# Patient Record
Sex: Female | Born: 1950 | Race: White | Hispanic: No | Marital: Married | Smoking: Never smoker
Health system: Southern US, Community
[De-identification: ages and names within clinical notes are randomized; demographics above are authoritative.]

## PROBLEM LIST (undated history)

## (undated) DIAGNOSIS — E7800 Pure hypercholesterolemia, unspecified: Secondary | ICD-10-CM

## (undated) DIAGNOSIS — L900 Lichen sclerosus et atrophicus: Secondary | ICD-10-CM

## (undated) DIAGNOSIS — E119 Type 2 diabetes mellitus without complications: Secondary | ICD-10-CM

## (undated) DIAGNOSIS — I10 Essential (primary) hypertension: Secondary | ICD-10-CM

## (undated) DIAGNOSIS — K219 Gastro-esophageal reflux disease without esophagitis: Secondary | ICD-10-CM

## (undated) DIAGNOSIS — F419 Anxiety disorder, unspecified: Secondary | ICD-10-CM

## (undated) DIAGNOSIS — E559 Vitamin D deficiency, unspecified: Secondary | ICD-10-CM

## (undated) HISTORY — DX: Anxiety disorder, unspecified: F41.9

## (undated) HISTORY — DX: Pure hypercholesterolemia, unspecified: E78.00

## (undated) HISTORY — DX: Vitamin D deficiency, unspecified: E55.9

## (undated) HISTORY — DX: Type 2 diabetes mellitus without complications: E11.9

## (undated) HISTORY — DX: Lichen sclerosus et atrophicus: L90.0

## (undated) HISTORY — PX: SHOULDER SURGERY: SHX246

## (undated) HISTORY — PX: TUBAL LIGATION: SHX77

## (undated) HISTORY — DX: Gastro-esophageal reflux disease without esophagitis: K21.9

## (undated) HISTORY — DX: Essential (primary) hypertension: I10

---

## 2005-01-21 ENCOUNTER — Ambulatory Visit: Payer: Self-pay | Admitting: Unknown Physician Specialty

## 2005-03-17 ENCOUNTER — Ambulatory Visit: Payer: Self-pay | Admitting: Internal Medicine

## 2006-05-19 ENCOUNTER — Ambulatory Visit: Payer: Self-pay | Admitting: Unknown Physician Specialty

## 2007-10-26 ENCOUNTER — Ambulatory Visit: Payer: Self-pay | Admitting: Unknown Physician Specialty

## 2007-12-20 ENCOUNTER — Ambulatory Visit: Payer: Self-pay | Admitting: Unknown Physician Specialty

## 2008-02-14 ENCOUNTER — Ambulatory Visit: Payer: Self-pay | Admitting: Unknown Physician Specialty

## 2008-12-24 ENCOUNTER — Ambulatory Visit: Payer: Self-pay | Admitting: Unknown Physician Specialty

## 2010-03-18 ENCOUNTER — Ambulatory Visit: Payer: Self-pay | Admitting: Unknown Physician Specialty

## 2011-06-16 ENCOUNTER — Ambulatory Visit: Payer: Self-pay | Admitting: Unknown Physician Specialty

## 2013-04-24 ENCOUNTER — Ambulatory Visit: Payer: Self-pay | Admitting: Internal Medicine

## 2013-06-02 ENCOUNTER — Ambulatory Visit: Payer: Self-pay | Admitting: Unknown Physician Specialty

## 2013-08-22 ENCOUNTER — Ambulatory Visit: Payer: Self-pay | Admitting: Unknown Physician Specialty

## 2013-08-22 DIAGNOSIS — I10 Essential (primary) hypertension: Secondary | ICD-10-CM

## 2013-08-22 LAB — BASIC METABOLIC PANEL
Anion Gap: 2 — ABNORMAL LOW (ref 7–16)
Calcium, Total: 9.4 mg/dL (ref 8.5–10.1)
Co2: 33 mmol/L — ABNORMAL HIGH (ref 21–32)
Osmolality: 283 (ref 275–301)

## 2013-08-25 ENCOUNTER — Ambulatory Visit: Payer: Self-pay | Admitting: Unknown Physician Specialty

## 2014-04-06 ENCOUNTER — Ambulatory Visit: Payer: Self-pay | Admitting: Gastroenterology

## 2014-05-02 DIAGNOSIS — E1122 Type 2 diabetes mellitus with diabetic chronic kidney disease: Secondary | ICD-10-CM | POA: Insufficient documentation

## 2014-05-02 DIAGNOSIS — K219 Gastro-esophageal reflux disease without esophagitis: Secondary | ICD-10-CM | POA: Insufficient documentation

## 2014-05-02 DIAGNOSIS — N1832 Chronic kidney disease, stage 3b: Secondary | ICD-10-CM | POA: Insufficient documentation

## 2014-05-02 DIAGNOSIS — E785 Hyperlipidemia, unspecified: Secondary | ICD-10-CM | POA: Insufficient documentation

## 2014-05-02 DIAGNOSIS — I10 Essential (primary) hypertension: Secondary | ICD-10-CM | POA: Insufficient documentation

## 2014-05-02 DIAGNOSIS — E119 Type 2 diabetes mellitus without complications: Secondary | ICD-10-CM | POA: Insufficient documentation

## 2014-06-01 ENCOUNTER — Ambulatory Visit: Payer: Self-pay | Admitting: Internal Medicine

## 2014-10-31 DIAGNOSIS — N182 Chronic kidney disease, stage 2 (mild): Secondary | ICD-10-CM | POA: Insufficient documentation

## 2014-12-22 NOTE — Op Note (Signed)
PATIENT NAME:  Krystal Hoffman, Krystal Hoffman MR#:  [id number] DATE OF BIRTH:  Nov 07, 1950  DATE OF PROCEDURE:  08/25/2013  PREOPERATIVE DIAGNOSIS:  Probable torn left rotator cuff with secondary impingement along with biceps tendinosis.   POSTOPERATIVE DIAGNOSIS:  Probable torn left rotator cuff with secondary impingement along with biceps tendinosis.  OPERATION:  Arthroscopic release of the long head biceps tendon plus arthroscopic subacromial decompression followed by mini incision rotator cuff repair.   SURGEON:  Alda Berthold., M.D.   ANESTHESIA:  Interscalene block plus general.   HISTORY:  The patient had a long history of left shoulder pain.  She had been refractory to injection of her left shoulder joint with steroid and anesthetic.  MRI was consistent with a probable rotator cuff tear along with some biceps tendinosis.  The patient was ultimately brought in for surgery due to persistent symptoms.   DESCRIPTION OF PROCEDURE:  The patient had an interscalene block on her left shoulder in the preop area and then was taken to the Operating Room where satisfactory general anesthesia was achieved.  The patient was turned to the lateral decubitus position with the left shoulder up.  The left shoulder was prepped and draped in the usual fashion for an arthroscopic procedure.  The shoulder was suspended with the Acufex shoulder suspension device.  Ten pounds of traction was maintained throughout the procedure.    The patient was given 1 gram of Kefzol IV at the start of the procedure.   The scope was introduced through a posterior puncture wound.  The joint was distended with lactated Ringer's.  Inspection of the glenohumeral joint revealed the articular surfaces were smooth.  The superior labrum was slightly frayed.  The biceps tendon appeared to be subluxed and there appeared to be a tear of the subscapularis.    There also appeared to be a significant rotator cuff tear anteriorly.    I went ahead and  established an anterior portal and then inserted an angled ArthroCare wand into the joint and used it to divide the attachment of the biceps tendon to the superior labrum.  I then inserted the scope into the subacromial space.  A lateral portal was established.  Visualization of the subacromial space revealed significant fraying of the inferior surface of the acromion.  There was some reactive synovitis laterally and anteriorly.  The patient had a tear of the anterior attachment of the supraspinatus.  The tear was several centimeters in width and it was retracted several centimeters.    I went ahead and removed the soft tissue under the acromion with an ArthroCare wand and then the scope was switched to the lateral portal and I brought an acromionizer bur through the posterior portal and performed a subacromial decompression.  The acromial attachment of the coracoacromial ligament was released at this time.    I then switched the scope back to the posterior portal and brought a large round bur in through the lateral portal and used it to lightly decorticate the intended reattachment site of the cuff to the greater tuberosity.   The instruments were removed from the shoulder joint and then the lateral portal was extended proximally to the edge of the acromion.  The deltoid was divided in line with its fibers.  A Gelpi retractor was inserted.  I went ahead and made some small venting holes in the greater tuberosity with an angled awl and then repaired the cuff to the greater tuberosity.  I used a double row repair.  Medially, I used a Electronics engineerpartan anchor with a #2 fiber wire and then more anterolaterally I used two 5.5 speed screws with a #2 fiber wire.  The sutures were  placed in horizontal mattress fashion.  I went ahead and irrigated the subacromial space.  The deltoid interval was closed with #1 Vicryl and the subcutaneous with 2-0 Vicryl and the skin with skin staples.  Puncture wound was closed with 3-0 nylon  in vertical mattress fashion.  Several milliliters of 0.5% Marcaine with epinephrine was injected about each puncture wound and then 4 to 5 mL was injected into the incision.   A sterile dressing were applied with four TENS pads and then a shoulder immobilizer was applied.  She was moved  to a stretcher bed and awakened and then taken to the recovery room in satisfactory condition.  Blood loss was negligible.    ____________________________ Alda BertholdHarold B. Makenzy Krist Jr., MD hbk:ea D: 08/25/2013 16:30:45 ET T: 08/26/2013 02:52:12 ET JOB#: [id number]  cc: Alda BertholdHarold B. Vonnetta Akey Jr., MD, <Dictator> Randon GoldsmithHAROLD B Avner Stroder, Montez HagemanJR MD ELECTRONICALLY SIGNED 09/25/2013 12:48

## 2015-05-22 ENCOUNTER — Encounter: Payer: Self-pay | Admitting: Obstetrics and Gynecology

## 2015-06-06 ENCOUNTER — Ambulatory Visit (INDEPENDENT_AMBULATORY_CARE_PROVIDER_SITE_OTHER): Payer: BC Managed Care – PPO | Admitting: Obstetrics and Gynecology

## 2015-06-06 ENCOUNTER — Encounter: Payer: Self-pay | Admitting: Obstetrics and Gynecology

## 2015-06-06 VITALS — BP 119/72 | HR 65 | Ht 67.0 in | Wt 259.8 lb

## 2015-06-06 DIAGNOSIS — Z0000 Encounter for general adult medical examination without abnormal findings: Secondary | ICD-10-CM | POA: Diagnosis not present

## 2015-06-06 DIAGNOSIS — Z1211 Encounter for screening for malignant neoplasm of colon: Secondary | ICD-10-CM | POA: Diagnosis not present

## 2015-06-06 DIAGNOSIS — L900 Lichen sclerosus et atrophicus: Secondary | ICD-10-CM | POA: Diagnosis not present

## 2015-06-06 DIAGNOSIS — Z780 Asymptomatic menopausal state: Secondary | ICD-10-CM

## 2015-06-06 DIAGNOSIS — E669 Obesity, unspecified: Secondary | ICD-10-CM | POA: Diagnosis not present

## 2015-06-06 DIAGNOSIS — IMO0001 Reserved for inherently not codable concepts without codable children: Secondary | ICD-10-CM | POA: Insufficient documentation

## 2015-06-06 DIAGNOSIS — Z01419 Encounter for gynecological examination (general) (routine) without abnormal findings: Secondary | ICD-10-CM

## 2015-06-06 DIAGNOSIS — Z1231 Encounter for screening mammogram for malignant neoplasm of breast: Secondary | ICD-10-CM

## 2015-06-06 MED ORDER — CLOBETASOL PROPIONATE 0.05 % EX OINT: 1.0000 "application " | TOPICAL_OINTMENT | CUTANEOUS | Status: DC | PRN

## 2015-06-06 NOTE — Progress Notes (Signed)
Patient ID: Krystal Hoffman, female   DOB: [birth year], 64 y.o.   MRN: [id number] ANNUAL PREVENTATIVE CARE GYN  ENCOUNTER NOTE  Subjective:       Krystal Hoffman is a 64 y.o. No obstetric history on file. female here for a routine annual gynecologic exam.  Current complaints: 1.  none  2.  History of lichen sclerosus, clinically stable on therapy   Gynecologic History No LMP recorded. Patient is postmenopausal. Contraception: post menopausal status Last Pap: 04/26/2013 neg/neg. Results were: normal Last mammogram: unsure last one. Results were: normal  Obstetric History OB History  No data available    Past Medical History  Diagnosis Date  . Lichen sclerosus   . Hypercholesteremia   . Diabetes mellitus without complication (HCC)   . Hypertension   . GERD (gastroesophageal reflux disease)   . Anxiety   . Vitamin D deficiency     Past Surgical History  Procedure Laterality Date  . Tubal ligation      1986  . Shoulder surgery      tear    No current outpatient prescriptions on file prior to visit.   No current facility-administered medications on file prior to visit.    Allergies  Allergen Reactions  . Metformin Diarrhea    Social History   Social History  . Marital Status: Married    Spouse Name: N/A  . Number of Children: N/A  . Years of Education: N/A   Occupational History  . Not on file.   Social History Main Topics  . Smoking status: Never Smoker   . Smokeless tobacco: Not on file  . Alcohol Use: No  . Drug Use: No  . Sexual Activity: Yes    Birth Control/ Protection: Post-menopausal   Other Topics Concern  . Not on file   Social History Narrative  . No narrative on file    Family History  Problem Relation Age of Onset  . Lung cancer Mother   . Uterine cancer Mother   . Brain cancer Father   . Ovarian cancer Neg Hx   . Drug abuse Neg Hx   . Heart disease Neg Hx     The following portions of the patient's history were reviewed and updated as  appropriate: allergies, current medications, past family history, past medical history, past social history, past surgical history and problem list.  Review of Systems ROS Review of Systems - General ROS: negative for - chills, fatigue, fever, hot flashes, night sweats, weight gain or weight loss Psychological ROS: negative for - anxiety, decreased libido, depression, mood swings, physical abuse or sexual abuse Ophthalmic ROS: negative for - blurry vision, eye pain or loss of vision ENT ROS: negative for - headaches, hearing change, visual changes or vocal changes Allergy and Immunology ROS: negative for - hives, itchy/watery eyes or seasonal allergies Hematological and Lymphatic ROS: negative for - bleeding problems, bruising, swollen lymph nodes or weight loss Endocrine ROS: negative for - galactorrhea, hair pattern changes, hot flashes, malaise/lethargy, mood swings, palpitations, polydipsia/polyuria, skin changes, temperature intolerance or unexpected weight changes Breast ROS: negative for - new or changing breast lumps or nipple discharge Respiratory ROS: negative for - cough or shortness of breath Cardiovascular ROS: negative for - chest pain, irregular heartbeat, palpitations or shortness of breath Gastrointestinal ROS: no abdominal pain, change in bowel habits, or black or bloody stools Genito-Urinary ROS: no dysuria, trouble voiding, or hematuria Musculoskeletal ROS: negative for - joint pain or joint stiffness Neurological ROS: negative for -  bowel and bladder control changes Dermatological ROS: negative for rash and skin lesion changes   Objective:   BP 119/72 mmHg  Pulse 65  Ht  (1.702 m)  Wt 259 lb 12.8 oz (117.845 kg)  BMI 40.68 kg/m2 CONSTITUTIONAL: Well-developed, well-nourished female in no acute distress.  PSYCHIATRIC: Normal mood and affect. Normal behavior. Normal judgment and thought content. NEUROLGIC: Alert and oriented to person, place, and time. Normal  muscle tone coordination. No cranial nerve deficit noted. HENT:  Normocephalic, atraumatic, External right and left ear normal. Oropharynx is clear and moist EYES: Conjunctivae and EOM are normal. Pupils are equal, round, and reactive to light. No scleral icterus.  NECK: Normal range of motion, supple, no masses.  Normal thyroid.  SKIN: Skin is warm and dry. No rash noted. Not diaphoretic. No erythema. No pallor. CARDIOVASCULAR: Normal heart rate noted, regular rhythm, no murmur. RESPIRATORY: Clear to auscultation bilaterally. Effort and breath sounds normal, no problems with respiration noted. BREASTS: Symmetric in size. No masses, skin changes, nipple drainage, or lymphadenopathy. ABDOMEN: Soft, normal bowel sounds, no distention noted.  No tenderness, rebound or guarding.  BLADDER: Normal PELVIC:  External Genitalia: Normal  BUS: Normal  Vagina: Normal; mild atrophic changes  Cervix: Normal; no cervical motion tenderness  Uterus: Normal; mobile, midline, nontender, nonenlarged  Adnexa: Normal  RV: External Exam NormaI, No Rectal Masses and Normal Sphincter tone  MUSCULOSKELETAL: Normal range of motion. No tenderness.  No cyanosis, clubbing, or edema.  2+ distal pulses. LYMPHATIC: No Axillary, Supraclavicular, or Inguinal Adenopathy.    Assessment:   Annual gynecologic examination 64 y.o. Contraception: post menopausal status Obesity 1 Lichen sclerosis, stable  Plan:  Pap: Not needed Mammogram: Ordered Stool Guaiac Testing:  Ordered Labs: thru pcp Routine preventative health maintenance measures emphasized: Exercise/Diet/Weight control, Tobacco Warnings and Alcohol/Substance use risks Refill clobetasol; applied topically twice weekly Return to Clinic - 1 [address] [place], CMA  Herold Harms, MD

## 2015-06-06 NOTE — Patient Instructions (Signed)
1.  Pap smear not done until 2017 2.  Mammogram ordered. 3.  Stool guaiac cards testing ordered. 4.  Clobetasol 0.05% refilled; apply topically to the vulva twice a week. 5.   Return in 1 year physical.

## 2015-06-27 LAB — FECAL OCCULT BLOOD, IMMUNOCHEMICAL: FECAL OCCULT BLD: NEGATIVE

## 2015-06-28 ENCOUNTER — Ambulatory Visit: Payer: BC Managed Care – PPO | Attending: Obstetrics and Gynecology

## 2015-07-01 ENCOUNTER — Ambulatory Visit
Admission: RE | Admit: 2015-07-01 | Discharge: 2015-07-01 | Disposition: A | Discharge status: Home / Self Care | Payer: BC Managed Care – PPO | Source: Ambulatory Visit | Attending: Obstetrics and Gynecology | Admitting: Obstetrics and Gynecology

## 2015-07-01 DIAGNOSIS — Z1231 Encounter for screening mammogram for malignant neoplasm of breast: Secondary | ICD-10-CM | POA: Diagnosis present

## 2016-06-05 ENCOUNTER — Other Ambulatory Visit: Payer: Self-pay | Admitting: Internal Medicine

## 2016-06-05 DIAGNOSIS — Z1231 Encounter for screening mammogram for malignant neoplasm of breast: Secondary | ICD-10-CM

## 2016-06-08 NOTE — Progress Notes (Signed)
pANNUAL PREVENTATIVE CARE GYN  ENCOUNTER NOTE  Subjective:       Krystal Hoffman is a 65 y.o. G3  P 2012  female here for a routine annual gynecologic exam.  Current complaints: 1.  None 2. History of lichen sclerosus, clinically stable with therapy once or twice every 1-2 weeks  No significant vasomotor symptoms. Bowel and bladder function are reportedly normal. Her weight has waxed and waned over the past year   Gynecologic History No LMP recorded. Patient is postmenopausal. Contraception: post menopausal status Last Pap: 03/2013 neg/neg. Results were: normal Last mammogram: 08/2014 birad 1. Results were: normal  Obstetric History OB History  No data available    Past Medical History:  Diagnosis Date  . Anxiety   . Diabetes mellitus without complication (HCC)   . GERD (gastroesophageal reflux disease)   . Hypercholesteremia   . Hypertension   . Lichen sclerosus   . Vitamin D deficiency     Past Surgical History:  Procedure Laterality Date  . SHOULDER SURGERY     tear  . TUBAL LIGATION     1986    Current Outpatient Prescriptions on File Prior to Visit  Medication Sig Dispense Refill  . amLODipine (NORVASC) 10 MG tablet TAKE 1 TABLET DAILY (CALL FOR APPOINTMENT)    . atorvastatin (LIPITOR) 10 MG tablet TAKE 1 TABLET DAILY    . clobetasol ointment (TEMOVATE) 0.05 % Apply 1 application topically as needed. 30 g 6  . FLUoxetine (PROZAC) 20 MG capsule TAKE 1 CAPSULE DAILY    . glimepiride (AMARYL) 4 MG tablet TAKE ONE-HALF (1/2) TABLET TWICE A DAY    . glucose blood (BAYER CONTOUR NEXT TEST) test strip USE TWICE A DAY    . hydrochlorothiazide (MICROZIDE) 12.5 MG capsule TAKE 1 CAPSULE DAILY    . losartan (COZAAR) 100 MG tablet TAKE 1 TABLET DAILY (CALL FOR APPOINTMENT, NEED COMPLETE PHYSICAL EXAM)    . Multiple Vitamins tablet Take by mouth.    Marland Kitchen. omeprazole (PRILOSEC) 20 MG capsule TAKE 1 CAPSULE DAILY    . sitaGLIPtin (JANUVIA) 100 MG tablet Take by mouth.    . Vitamin D,  Ergocalciferol, (DRISDOL) 50000 UNITS CAPS capsule TAKE 1 CAPSULE TWICE WEEKLY     No current facility-administered medications on file prior to visit.     Allergies  Allergen Reactions  . Metformin Diarrhea    Social History   Social History  . Marital status: Married    Spouse name: N/A  . Number of children: N/A  . Years of education: N/A   Occupational History  . Not on file.   Social History Main Topics  . Smoking status: Never Smoker  . Smokeless tobacco: Not on file  . Alcohol use No  . Drug use: No  . Sexual activity: Yes    Birth control/ protection: Post-menopausal   Other Topics Concern  . Not on file   Social History Narrative  . No narrative on file    Family History  Problem Relation Age of Onset  . Lung cancer Mother   . Uterine cancer Mother   . Brain cancer Father   . Ovarian cancer Neg Hx   . Drug abuse Neg Hx   . Heart disease Neg Hx     The following portions of the patient's history were reviewed and updated as appropriate: allergies, current medications, past family history, past medical history, past social history, past surgical history and problem list.  Review of Systems ROS Review of  Systems - General ROS: negative for - chills, fatigue, fever, hot flashes, night sweats, weight gain or weight loss Psychological ROS: negative for - anxiety, decreased libido, depression, mood swings, physical abuse or sexual abuse Ophthalmic ROS: negative for - blurry vision, eye pain or loss of vision ENT ROS: negative for - headaches, hearing change, visual changes or vocal changes Allergy and Immunology ROS: negative for - hives, itchy/watery eyes or seasonal allergies Hematological and Lymphatic ROS: negative for - bleeding problems, bruising, swollen lymph nodes or weight loss Endocrine ROS: negative for - galactorrhea, hair pattern changes, hot flashes, malaise/lethargy, mood swings, palpitations, polydipsia/polyuria, skin changes, temperature  intolerance or unexpected weight changes Breast ROS: negative for - new or changing breast lumps or nipple discharge Respiratory ROS: negative for - cough or shortness of breath Cardiovascular ROS: negative for - chest pain, irregular heartbeat, palpitations or shortness of breath Gastrointestinal ROS: no abdominal pain, change in bowel habits, or black or bloody stools Genito-Urinary ROS: no dysuria, trouble voiding, or hematuria Musculoskeletal ROS: negative for - joint pain or joint stiffness Neurological ROS: negative for - bowel and bladder control changes Dermatological ROS: negative for rash and skin lesion changes   Objective:  BP 133/74   Pulse 76   Ht 5\' 7"  (1.702 m)   Wt 282 lb 1.6 oz (128 kg)   BMI 44.18 kg/m  CONSTITUTIONAL: Well-developed, well-nourished female in no acute distress.  PSYCHIATRIC: Normal mood and affect. Normal behavior. Normal judgment and thought content. NEUROLGIC: Alert and oriented to person, place, and time. Normal muscle tone coordination. No cranial nerve deficit noted. HENT:  Normocephalic, atraumatic, External right and left ear normal. Oropharynx is clear and moist EYES: Conjunctivae and EOM are normal. . No scleral icterus.  NECK: Normal range of motion, supple, no masses.  Normal thyroid.  SKIN: Skin is warm and dry. No rash noted. Not diaphoretic. No erythema. No pallor. CARDIOVASCULAR: Normal heart rate noted, regular rhythm, no murmur. RESPIRATORY: Clear to auscultation bilaterally. Effort and breath sounds normal, no problems with respiration noted. BREASTS: Symmetric in size. No masses, skin changes, nipple drainage, or lymphadenopathy. ABDOMEN: Soft, normal bowel sounds, no distention noted.  No tenderness, rebound or guarding.  BLADDER: Normal PELVIC:  External Genitalia: Normal; labia minora slightly agglutinated to the majora; no leukoplakia or epithelial erosions are noted  BUS: Normal  Vagina: Mild atrophy  Cervix: Normal; no  cervical motion tenderness  Uterus: Normal; midplane, normal size and shape, nontender  Adnexa: Normal; nonpalpable and nontender  RV: External Exam NormaI, No Rectal Masses and Normal Sphincter tone  MUSCULOSKELETAL: Normal range of motion. No tenderness.  No cyanosis, clubbing, or edema.  2+ distal pulses. LYMPHATIC: No Axillary, Supraclavicular, or Inguinal Adenopathy.    Assessment:   Annual gynecologic examination 65 y.o. Contraception: post menopausal status bmi-40 Problem List Items Addressed This Visit    Menopause    Other Visit Diagnoses    Well woman exam    -  Primary   Encounter for screening mammogram for breast cancer       Screening for colon cancer       Class 3 obesity due to excess calories with body mass index (BMI) of 40.0 to 44.9 in adult, unspecified whether serious comorbidity present (HCC)         Lichen sclerosus, clinically stable Plan:  Pap: Pap Co Test Mammogram: scheduled for 07/14/2016 Stool Guaiac Testing:  Ordered Labs: thru pcp Routine preventative health maintenance measures emphasized: Exercise/Diet/Weight control, Tobacco Warnings, Alcohol/Substance  use risks, Stress Management and Peer Pressure Issues Continue with clobetasol ointment 1-2 times per week as needed Return to Clinic - 1 Year   [place], CMA  Herold Harms, MD  Note: This dictation was prepared with Dragon dictation along with smaller phrase technology. Any transcriptional errors that result from this process are unintentional.

## 2016-06-10 ENCOUNTER — Encounter: Payer: Self-pay | Admitting: Obstetrics and Gynecology

## 2016-06-10 ENCOUNTER — Ambulatory Visit (INDEPENDENT_AMBULATORY_CARE_PROVIDER_SITE_OTHER): Payer: BC Managed Care – PPO | Admitting: Obstetrics and Gynecology

## 2016-06-10 VITALS — BP 133/74 | HR 76 | Ht 67.0 in | Wt 282.1 lb

## 2016-06-10 DIAGNOSIS — E6609 Other obesity due to excess calories: Secondary | ICD-10-CM

## 2016-06-10 DIAGNOSIS — Z1211 Encounter for screening for malignant neoplasm of colon: Secondary | ICD-10-CM

## 2016-06-10 DIAGNOSIS — Z1231 Encounter for screening mammogram for malignant neoplasm of breast: Secondary | ICD-10-CM

## 2016-06-10 DIAGNOSIS — L900 Lichen sclerosus et atrophicus: Secondary | ICD-10-CM | POA: Diagnosis not present

## 2016-06-10 DIAGNOSIS — Z780 Asymptomatic menopausal state: Secondary | ICD-10-CM

## 2016-06-10 DIAGNOSIS — Z6841 Body Mass Index (BMI) 40.0 and over, adult: Secondary | ICD-10-CM

## 2016-06-10 DIAGNOSIS — Z01419 Encounter for gynecological examination (general) (routine) without abnormal findings: Secondary | ICD-10-CM | POA: Diagnosis not present

## 2016-06-10 DIAGNOSIS — IMO0001 Reserved for inherently not codable concepts without codable children: Secondary | ICD-10-CM

## 2016-06-10 NOTE — Patient Instructions (Signed)
1. Pap smear is completed 2. Mammogram already ordered 3. Stool guaiac cards are given for colon cancer screening 4. Continue with healthy eating and exercise with controlled weight loss 5. Add calcium 1200 mg a day to her vitamin D regimen 6. Return in 1 year for annual exam

## 2016-06-18 LAB — PAP IG AND HPV HIGH-RISK
HPV, HIGH-RISK: NEGATIVE
PAP SMEAR COMMENT: 0

## 2016-07-03 LAB — FECAL OCCULT BLOOD, IMMUNOCHEMICAL: FECAL OCCULT BLD: NEGATIVE

## 2016-07-14 ENCOUNTER — Ambulatory Visit
Admission: RE | Admit: 2016-07-14 | Discharge: 2016-07-14 | Disposition: A | Discharge status: Home / Self Care | Payer: Medicare Other | Source: Ambulatory Visit | Attending: Internal Medicine | Admitting: Internal Medicine

## 2016-07-14 DIAGNOSIS — Z1231 Encounter for screening mammogram for malignant neoplasm of breast: Secondary | ICD-10-CM | POA: Insufficient documentation

## 2016-08-11 ENCOUNTER — Other Ambulatory Visit: Payer: Self-pay

## 2016-08-11 MED ORDER — CLOBETASOL PROPIONATE 0.05 % EX OINT: 1.0000 "application " | TOPICAL_OINTMENT | CUTANEOUS | 6 refills | Status: DC | PRN

## 2017-06-14 NOTE — Progress Notes (Deleted)
pANNUAL PREVENTATIVE CARE GYN  ENCOUNTER NOTE  Subjective:       Krystal Hoffman is a 66 y.o. G3  P 2012  female here for a routine annual gynecologic exam.  Current complaints: 1.  None 2. History of lichen sclerosus, clinically stable with therapy once or twice every 1-2 weeks  No significant vasomotor symptoms. Bowel and bladder function are reportedly normal. Her weight has waxed and waned over the past year   Gynecologic History No LMP recorded. Patient is postmenopausal. Contraception: post menopausal status Last Pap: 05/2016 neg/neg. Results were: normal Last mammogram: 07/2016 birad 2 . Results were: normal  Obstetric History OB History  Gravida Para Term Preterm AB Living  SAB TAB Ectopic Multiple Live Births  1       2    # Outcome Date GA Lbr Len/2nd Weight Sex Delivery Anes PTL Lv  3 Term 22    M Vag-Spont   LIV  2 Term 55    F Vag-Spont   LIV  1 SAB               Past Medical History:  Diagnosis Date  . Anxiety   . Diabetes mellitus without complication (HCC)   . GERD (gastroesophageal reflux disease)   . Hypercholesteremia   . Hypertension   . Lichen sclerosus   . Vitamin D deficiency     Past Surgical History:  Procedure Laterality Date  . SHOULDER SURGERY     tear  . TUBAL LIGATION     1986    Current Outpatient Prescriptions on File Prior to Visit  Medication Sig Dispense Refill  . amLODipine (NORVASC) 10 MG tablet TAKE 1 TABLET DAILY (CALL FOR APPOINTMENT)    . atorvastatin (LIPITOR) 10 MG tablet TAKE 1 TABLET DAILY    . clobetasol ointment (TEMOVATE) 0.05 % Apply 1 application topically as needed. 30 g 6  . FLUoxetine (PROZAC) 20 MG capsule TAKE 1 CAPSULE DAILY    . glimepiride (AMARYL) 4 MG tablet TAKE ONE-HALF (1/2) TABLET TWICE A DAY    . glucose blood (BAYER CONTOUR NEXT TEST) test strip USE TWICE A DAY    . hydrochlorothiazide (MICROZIDE) 12.5 MG capsule TAKE 1 CAPSULE DAILY    . losartan (COZAAR) 100 MG tablet TAKE 1  TABLET DAILY (CALL FOR APPOINTMENT, NEED COMPLETE PHYSICAL EXAM)    . Multiple Vitamins tablet Take by mouth.    Marland Kitchen omeprazole (PRILOSEC) 20 MG capsule TAKE 1 CAPSULE DAILY    . sitaGLIPtin (JANUVIA) 100 MG tablet Take by mouth.    . Vitamin D, Ergocalciferol, (DRISDOL) 50000 UNITS CAPS capsule TAKE 1 CAPSULE TWICE WEEKLY     No current facility-administered medications on file prior to visit.     Allergies  Allergen Reactions  . Metformin Diarrhea    Social History   Social History  . Marital status: Married    Spouse name: N/A  . Number of children: N/A  . Years of education: N/A   Occupational History  . Not on file.   Social History Main Topics  . Smoking status: Never Smoker  . Smokeless tobacco: Never Used  . Alcohol use No  . Drug use: No  . Sexual activity: Yes    Birth control/ protection: Post-menopausal   Other Topics Concern  . Not on file   Social History Narrative  . No narrative on file    Family History  Problem Relation Age of Onset  .  Lung cancer Mother   . Uterine cancer Mother   . Brain cancer Father   . Ovarian cancer Neg Hx   . Drug abuse Neg Hx   . Heart disease Neg Hx   . Breast cancer Neg Hx   . Colon cancer Neg Hx   . Diabetes Neg Hx     The following portions of the patient's history were reviewed and updated as appropriate: allergies, current medications, past family history, past medical history, past social history, past surgical history and problem list.  Review of Systems ROS   Objective:  There were no vitals taken for this visit. CONSTITUTIONAL: Well-developed, well-nourished female in no acute distress.  PSYCHIATRIC: Normal mood and affect. Normal behavior. Normal judgment and thought content. NEUROLGIC: Alert and oriented to person, place, and time. Normal muscle tone coordination. No cranial nerve deficit noted. HENT:  Normocephalic, atraumatic, External right and left ear normal. Oropharynx is clear and moist EYES:  Conjunctivae and EOM are normal. . No scleral icterus.  NECK: Normal range of motion, supple, no masses.  Normal thyroid.  SKIN: Skin is warm and dry. No rash noted. Not diaphoretic. No erythema. No pallor. CARDIOVASCULAR: Normal heart rate noted, regular rhythm, no murmur. RESPIRATORY: Clear to auscultation bilaterally. Effort and breath sounds normal, no problems with respiration noted. BREASTS: Symmetric in size. No masses, skin changes, nipple drainage, or lymphadenopathy. ABDOMEN: Soft, normal bowel sounds, no distention noted.  No tenderness, rebound or guarding.  BLADDER: Normal PELVIC:  External Genitalia: Normal; labia minora slightly agglutinated to the majora; no leukoplakia or epithelial erosions are noted  BUS: Normal  Vagina: Mild atrophy  Cervix: Normal; no cervical motion tenderness  Uterus: Normal; midplane, normal size and shape, nontender  Adnexa: Normal; nonpalpable and nontender  RV: External Exam NormaI, No Rectal Masses and Normal Sphincter tone  MUSCULOSKELETAL: Normal range of motion. No tenderness.  No cyanosis, clubbing, or edema.  2+ distal pulses. LYMPHATIC: No Axillary, Supraclavicular, or Inguinal Adenopathy.    Assessment:   Annual gynecologic examination 66 y.o. Contraception: post menopausal status bmi-40 Problem List Items Addressed This Visit    Lichen sclerosus   Menopause    Other Visit Diagnoses    Well woman exam    -  Primary   Encounter for screening mammogram for breast cancer       Screening for colon cancer         Lichen sclerosus, clinically stable Plan:  Pap: Due 2020 Mammogram: ordered Stool Guaiac Testing:  Ordered Labs: thru pcp Routine preventative health maintenance measures emphasized: Exercise/Diet/Weight control, Tobacco Warnings, Alcohol/Substance use risks, Stress Management and Peer Pressure Issues Continue with clobetasol ointment 1-2 times per week as needed Return to Clinic - 1 Year   Darol [place], CMA    Note: This dictation was prepared with Dragon dictation along with smaller phrase technology. Any transcriptional errors that result from this process are unintentional.

## 2017-06-15 ENCOUNTER — Encounter: Payer: BC Managed Care – PPO | Admitting: Obstetrics and Gynecology

## 2017-09-02 ENCOUNTER — Other Ambulatory Visit: Payer: Self-pay | Admitting: Internal Medicine

## 2017-09-02 DIAGNOSIS — Z1231 Encounter for screening mammogram for malignant neoplasm of breast: Secondary | ICD-10-CM

## 2017-09-10 NOTE — Progress Notes (Signed)
pANNUAL PREVENTATIVE CARE GYN  ENCOUNTER NOTE  Subjective:       Krystal Hoffman is a 67 y.o. G3  P 2012  female here for a routine annual gynecologic exam.  Current complaints: 1.  None 2. History of lichen sclerosus, clinically stable with therapy once or twice every week.  No significant vasomotor symptoms. Bowel and bladder function are reportedly normal.   Patient was evaluated by primary care and then nephrology because of proteinuria; kidney ultrasound was negative.  Kidney function will be monitored long-term. Patient is seeing Dr. Laural Benes in primary care for monitoring of diabetes and obesity every 3 months.   Gynecologic History No LMP recorded. Patient is postmenopausal. Contraception: post menopausal status Last Pap: 05/2016 neg/neg. Results were: normal Last mammogram: 07/15/2016 birad 2. Results were: normal  Obstetric History OB History  Gravida Para Term Preterm AB Living  3 2 2   1 2   SAB TAB Ectopic Multiple Live Births  1       2    # Outcome Date GA Lbr Len/2nd Weight Sex Delivery Anes PTL Lv  3 Term 29    M Vag-Spont   LIV  2 Term 33    F Vag-Spont   LIV  1 SAB               Past Medical History:  Diagnosis Date  . Anxiety   . Diabetes mellitus without complication (HCC)   . GERD (gastroesophageal reflux disease)   . Hypercholesteremia   . Hypertension   . Lichen sclerosus   . Vitamin D deficiency     Past Surgical History:  Procedure Laterality Date  . SHOULDER SURGERY     tear  . TUBAL LIGATION     1986    Current Outpatient Medications on File Prior to Visit  Medication Sig Dispense Refill  . amLODipine (NORVASC) 10 MG tablet TAKE 1 TABLET DAILY (CALL FOR APPOINTMENT)    . atorvastatin (LIPITOR) 10 MG tablet TAKE 1 TABLET DAILY    . clobetasol ointment (TEMOVATE) 0.05 % Apply 1 application topically as needed. 30 g 6  . FLUoxetine (PROZAC) 20 MG capsule TAKE 1 CAPSULE DAILY    . glimepiride (AMARYL) 4 MG tablet TAKE ONE-HALF (1/2)  TABLET TWICE A DAY    . glucose blood (BAYER CONTOUR NEXT TEST) test strip USE TWICE A DAY    . hydrochlorothiazide (MICROZIDE) 12.5 MG capsule TAKE 1 CAPSULE DAILY    . losartan (COZAAR) 100 MG tablet TAKE 1 TABLET DAILY (CALL FOR APPOINTMENT, NEED COMPLETE PHYSICAL EXAM)    . Multiple Vitamins tablet Take by mouth.    Marland Kitchen omeprazole (PRILOSEC) 20 MG capsule TAKE 1 CAPSULE DAILY    . sitaGLIPtin (JANUVIA) 100 MG tablet Take by mouth.    . Vitamin D, Ergocalciferol, (DRISDOL) 50000 UNITS CAPS capsule TAKE 1 CAPSULE TWICE WEEKLY     No current facility-administered medications on file prior to visit.     Allergies  Allergen Reactions  . Metformin Diarrhea    Social History   Socioeconomic History  . Marital status: Married    Spouse name: Not on file  . Number of children: Not on file  . Years of education: Not on file  . Highest education level: Not on file  Social Needs  . Financial resource strain: Not on file  . Food insecurity - worry: Not on file  . Food insecurity - inability: Not on file  . Transportation needs - medical: Not on  file  . Transportation needs - non-medical: Not on file  Occupational History  . Not on file  Tobacco Use  . Smoking status: Never Smoker  . Smokeless tobacco: Never Used  Substance and Sexual Activity  . Alcohol use: No  . Drug use: No  . Sexual activity: Yes    Birth control/protection: Post-menopausal  Other Topics Concern  . Not on file  Social History Narrative  . Not on file    Family History  Problem Relation Age of Onset  . Lung cancer Mother   . Uterine cancer Mother   . Brain cancer Father   . Ovarian cancer Neg Hx   . Drug abuse Neg Hx   . Heart disease Neg Hx   . Breast cancer Neg Hx   . Colon cancer Neg Hx   . Diabetes Neg Hx     The following portions of the patient's history were reviewed and updated as appropriate: allergies, current medications, past family history, past medical history, past social history,  past surgical history and problem list.  Review of Systems Review of Systems  Constitutional: Negative.   HENT: Negative.   Eyes: Negative.   Respiratory: Negative.   Cardiovascular: Negative.   Genitourinary: Negative.   Musculoskeletal: Negative.   Skin: Negative.   Neurological: Negative.   Endo/Heme/Allergies: Negative.   Psychiatric/Behavioral: Negative.      Objective:  BP (!) 154/84   Pulse 69   Ht 5\' 7"  (1.702 m)   Wt 273 lb (123.8 kg)   BMI 42.76 kg/m  CONSTITUTIONAL: Well-developed, well-nourished female in no acute distress.  PSYCHIATRIC: Normal mood and affect. Normal behavior. Normal judgment and thought content. NEUROLGIC: Alert and oriented to person, place, and time. Normal muscle tone coordination. No cranial nerve deficit noted. HENT:  Normocephalic, atraumatic, External right and left ear normal. Oropharynx is clear and moist EYES: Conjunctivae and EOM are normal. . No scleral icterus.  NECK: Normal range of motion, supple, no masses.  Normal thyroid.  SKIN: Skin is warm and dry. No rash noted. Not diaphoretic. No erythema. No pallor. CARDIOVASCULAR: Normal heart rate noted, regular rhythm, no murmur. RESPIRATORY: Clear to auscultation bilaterally. Effort and breath sounds normal, no problems with respiration noted. BREASTS: Symmetric in size. No masses, skin changes, nipple drainage, or lymphadenopathy. ABDOMEN: Soft, normal bowel sounds, no distention noted.  No tenderness, rebound or guarding.  BLADDER: Normal PELVIC:  External Genitalia: Normal; labia minora slightly agglutinated to the majora; no leukoplakia or epithelial erosions are noted  BUS: Normal  Vagina: Mild atrophy  Cervix: Normal; no cervical motion tenderness  Uterus: Normal; midplane, normal size and shape, nontender  Adnexa: Normal; nonpalpable and nontender  RV: External Exam NormaI, No Rectal Masses and Normal Sphincter tone  MUSCULOSKELETAL: Normal range of motion. No tenderness.   No cyanosis, clubbing, or edema.  2+ distal pulses. LYMPHATIC: No Axillary, Supraclavicular, or Inguinal Adenopathy.    Assessment:   Annual gynecologic examination 67 y.o. Contraception: post menopausal status bmi- 42 Problem List Items Addressed This Visit    Lichen sclerosus   Menopause    Other Visit Diagnoses    Well woman exam    -  Primary   Encounter for screening mammogram for breast cancer       Screening for colon cancer        Lichen sclerosus, clinically stable Plan:  Pap: DUE 2020 Mammogram: scheduled for 09/21/2017 Stool Guaiac Testing:  Ordered  Labs: thru pcp Routine preventative health maintenance measures emphasized:  Exercise/Diet/Weight control, Tobacco Warnings, Alcohol/Substance use risks, Stress Management and Peer Pressure Issues Continue with clobetasol ointment 1-2 times per week as needed Return to Clinic - 1 Year   Krystal Hoffman, CMA  Krystal HarmsMartin A Defrancesco, MD   Note: This dictation was prepared with Dragon dictation along with smaller phrase technology. Any transcriptional errors that result from this process are unintentional.

## 2017-09-15 ENCOUNTER — Ambulatory Visit (INDEPENDENT_AMBULATORY_CARE_PROVIDER_SITE_OTHER): Payer: Medicare Other | Admitting: Obstetrics and Gynecology

## 2017-09-15 ENCOUNTER — Encounter: Payer: Self-pay | Admitting: Obstetrics and Gynecology

## 2017-09-15 VITALS — BP 154/84 | HR 69 | Ht 67.0 in | Wt 273.0 lb

## 2017-09-15 DIAGNOSIS — Z01419 Encounter for gynecological examination (general) (routine) without abnormal findings: Secondary | ICD-10-CM

## 2017-09-15 DIAGNOSIS — Z8049 Family history of malignant neoplasm of other genital organs: Secondary | ICD-10-CM | POA: Diagnosis not present

## 2017-09-15 DIAGNOSIS — Z780 Asymptomatic menopausal state: Secondary | ICD-10-CM | POA: Diagnosis not present

## 2017-09-15 DIAGNOSIS — Z1239 Encounter for other screening for malignant neoplasm of breast: Secondary | ICD-10-CM

## 2017-09-15 DIAGNOSIS — Z1231 Encounter for screening mammogram for malignant neoplasm of breast: Secondary | ICD-10-CM

## 2017-09-15 DIAGNOSIS — Z1211 Encounter for screening for malignant neoplasm of colon: Secondary | ICD-10-CM | POA: Diagnosis not present

## 2017-09-15 DIAGNOSIS — Z124 Encounter for screening for malignant neoplasm of cervix: Secondary | ICD-10-CM | POA: Diagnosis not present

## 2017-09-15 DIAGNOSIS — L900 Lichen sclerosus et atrophicus: Secondary | ICD-10-CM | POA: Diagnosis not present

## 2017-09-15 NOTE — Patient Instructions (Signed)
1.  No Pap smear done.  Next Pap smear is due in 2020 2.  Mammogram ordered 3.  Stool guaiac cards are given for colon cancer screening 4.  Continue with healthy eating and exercise 5.  Continue with clobetasol ointment 0.05% topically to the vulva once or twice a week to control symptoms 6.  Continue with calcium and vitamin D supplementation twice a day 7.  Return in 1 year for annual exam   Health Maintenance for Postmenopausal Women Menopause is a normal process in which your reproductive ability comes to an end. This process happens gradually over a span of months to years, usually between the ages of 20 and 73. Menopause is complete when you have missed 12 consecutive menstrual periods. It is important to talk with your health care provider about some of the most common conditions that affect postmenopausal women, such as heart disease, cancer, and bone loss (osteoporosis). Adopting a healthy lifestyle and getting preventive care can help to promote your health and wellness. Those actions can also lower your chances of developing some of these common conditions. What should I know about menopause? During menopause, you may experience a number of symptoms, such as:  Moderate-to-severe hot flashes.  Night sweats.  Decrease in sex drive.  Mood swings.  Headaches.  Tiredness.  Irritability.  Memory problems.  Insomnia.  Choosing to treat or not to treat menopausal changes is an individual decision that you make with your health care provider. What should I know about hormone replacement therapy and supplements? Hormone therapy products are effective for treating symptoms that are associated with menopause, such as hot flashes and night sweats. Hormone replacement carries certain risks, especially as you become older. If you are thinking about using estrogen or estrogen with progestin treatments, discuss the benefits and risks with your health care provider. What should I know  about heart disease and stroke? Heart disease, heart attack, and stroke become more likely as you age. This may be due, in part, to the hormonal changes that your body experiences during menopause. These can affect how your body processes dietary fats, triglycerides, and cholesterol. Heart attack and stroke are both medical emergencies. There are many things that you can do to help prevent heart disease and stroke:  Have your blood pressure checked at least every 1-2 years. High blood pressure causes heart disease and increases the risk of stroke.  If you are 57-40 years old, ask your health care provider if you should take aspirin to prevent a heart attack or a stroke.  Do not use any tobacco products, including cigarettes, chewing tobacco, or electronic cigarettes. If you need help quitting, ask your health care provider.  It is important to eat a healthy diet and maintain a healthy weight. ? Be sure to include plenty of vegetables, fruits, low-fat dairy products, and lean protein. ? Avoid eating foods that are high in solid fats, added sugars, or salt (sodium).  Get regular exercise. This is one of the most important things that you can do for your health. ? Try to exercise for at least 150 minutes each week. The type of exercise that you do should increase your heart rate and make you sweat. This is known as moderate-intensity exercise. ? Try to do strengthening exercises at least twice each week. Do these in addition to the moderate-intensity exercise.  Know your numbers.Ask your health care provider to check your cholesterol and your blood glucose. Continue to have your blood tested as directed by  your health care provider.  What should I know about cancer screening? There are several types of cancer. Take the following steps to reduce your risk and to catch any cancer development as early as possible. Breast Cancer  Practice breast self-awareness. ? This means understanding how your  breasts normally appear and feel. ? It also means doing regular breast self-exams. Let your health care provider know about any changes, no matter how small.  If you are 5 or older, have a clinician do a breast exam (clinical breast exam or CBE) every year. Depending on your age, family history, and medical history, it may be recommended that you also have a yearly breast X-ray (mammogram).  If you have a family history of breast cancer, talk with your health care provider about genetic screening.  If you are at high risk for breast cancer, talk with your health care provider about having an MRI and a mammogram every year.  Breast cancer (BRCA) gene test is recommended for women who have family members with BRCA-related cancers. Results of the assessment will determine the need for genetic counseling and BRCA1 and for BRCA2 testing. BRCA-related cancers include these types: ? Breast. This occurs in males or females. ? Ovarian. ? Tubal. This may also be called fallopian tube cancer. ? Cancer of the abdominal or pelvic lining (peritoneal cancer). ? Prostate. ? Pancreatic.  Cervical, Uterine, and Ovarian Cancer Your health care provider may recommend that you be screened regularly for cancer of the pelvic organs. These include your ovaries, uterus, and vagina. This screening involves a pelvic exam, which includes checking for microscopic changes to the surface of your cervix (Pap test).  For women ages 21-65, health care providers may recommend a pelvic exam and a Pap test every three years. For women ages 48-65, they may recommend the Pap test and pelvic exam, combined with testing for human papilloma virus (HPV), every five years. Some types of HPV increase your risk of cervical cancer. Testing for HPV may also be done on women of any age who have unclear Pap test results.  Other health care providers may not recommend any screening for nonpregnant women who are considered low risk for pelvic  cancer and have no symptoms. Ask your health care provider if a screening pelvic exam is right for you.  If you have had past treatment for cervical cancer or a condition that could lead to cancer, you need Pap tests and screening for cancer for at least 20 years after your treatment. If Pap tests have been discontinued for you, your risk factors (such as having a new sexual partner) need to be reassessed to determine if you should start having screenings again. Some women have medical problems that increase the chance of getting cervical cancer. In these cases, your health care provider may recommend that you have screening and Pap tests more often.  If you have a family history of uterine cancer or ovarian cancer, talk with your health care provider about genetic screening.  If you have vaginal bleeding after reaching menopause, tell your health care provider.  There are currently no reliable tests available to screen for ovarian cancer.  Lung Cancer Lung cancer screening is recommended for adults 50-66 years old who are at high risk for lung cancer because of a history of smoking. A yearly low-dose CT scan of the lungs is recommended if you:  Currently smoke.  Have a history of at least 30 pack-years of smoking and you currently smoke or  have quit within the past 15 years. A pack-year is smoking an average of one pack of cigarettes per day for one year.  Yearly screening should:  Continue until it has been 15 years since you quit.  Stop if you develop a health problem that would prevent you from having lung cancer treatment.  Colorectal Cancer  This type of cancer can be detected and can often be prevented.  Routine colorectal cancer screening usually begins at age 98 and continues through age 68.  If you have risk factors for colon cancer, your health care provider may recommend that you be screened at an earlier age.  If you have a family history of colorectal cancer, talk with  your health care provider about genetic screening.  Your health care provider may also recommend using home test kits to check for hidden blood in your stool.  A small camera at the end of a tube can be used to examine your colon directly (sigmoidoscopy or colonoscopy). This is done to check for the earliest forms of colorectal cancer.  Direct examination of the colon should be repeated every 5-10 years until age 1. However, if early forms of precancerous polyps or small growths are found or if you have a family history or genetic risk for colorectal cancer, you may need to be screened more often.  Skin Cancer  Check your skin from head to toe regularly.  Monitor any moles. Be sure to tell your health care provider: ? About any new moles or changes in moles, especially if there is a change in a mole's shape or color. ? If you have a mole that is larger than the size of a pencil eraser.  If any of your family members has a history of skin cancer, especially at a young age, talk with your health care provider about genetic screening.  Always use sunscreen. Apply sunscreen liberally and repeatedly throughout the day.  Whenever you are outside, protect yourself by wearing long sleeves, pants, a wide-brimmed hat, and sunglasses.  What should I know about osteoporosis? Osteoporosis is a condition in which bone destruction happens more quickly than new bone creation. After menopause, you may be at an increased risk for osteoporosis. To help prevent osteoporosis or the bone fractures that can happen because of osteoporosis, the following is recommended:  If you are 74-70 years old, get at least 1,000 mg of calcium and at least 600 mg of vitamin D per day.  If you are older than age 77 but younger than age 28, get at least 1,200 mg of calcium and at least 600 mg of vitamin D per day.  If you are older than age 71, get at least 1,200 mg of calcium and at least 800 mg of vitamin D per  day.  Smoking and excessive alcohol intake increase the risk of osteoporosis. Eat foods that are rich in calcium and vitamin D, and do weight-bearing exercises several times each week as directed by your health care provider. What should I know about how menopause affects my mental health? Depression may occur at any age, but it is more common as you become older. Common symptoms of depression include:  Low or sad mood.  Changes in sleep patterns.  Changes in appetite or eating patterns.  Feeling an overall lack of motivation or enjoyment of activities that you previously enjoyed.  Frequent crying spells.  Talk with your health care provider if you think that you are experiencing depression. What should I know  about immunizations? It is important that you get and maintain your immunizations. These include:  Tetanus, diphtheria, and pertussis (Tdap) booster vaccine.  Influenza every year before the flu season begins.  Pneumonia vaccine.  Shingles vaccine.  Your health care provider may also recommend other immunizations. This information is not intended to replace advice given to you by your health care provider. Make sure you discuss any questions you have with your health care provider. Document Released: 10/09/2005 Document Revised: 03/06/2016 Document Reviewed: 05/21/2015 Elsevier Interactive Patient Education  2018 [employer].

## 2017-09-21 ENCOUNTER — Ambulatory Visit
Admission: RE | Admit: 2017-09-21 | Discharge: 2017-09-21 | Disposition: A | Discharge status: Home / Self Care | Payer: Medicare Other | Source: Ambulatory Visit | Attending: Internal Medicine | Admitting: Internal Medicine

## 2017-09-21 DIAGNOSIS — Z1231 Encounter for screening mammogram for malignant neoplasm of breast: Secondary | ICD-10-CM | POA: Insufficient documentation

## 2017-10-09 LAB — FECAL OCCULT BLOOD, IMMUNOCHEMICAL: FECAL OCCULT BLD: NEGATIVE

## 2017-10-11 ENCOUNTER — Telehealth: Payer: Self-pay | Admitting: Obstetrics and Gynecology

## 2017-10-11 NOTE — Telephone Encounter (Signed)
The patient called and stated that she never received her results from her last pap on 09/15/17. The patient would like to speak with [name] in regards to that and also to know if that information can now moving forward. Please advise.

## 2017-10-11 NOTE — Telephone Encounter (Signed)
Pt aware pap smear was not done. Last pap 2017 neg/neg. Pt aware pap due 2020.

## 2019-02-15 ENCOUNTER — Other Ambulatory Visit: Payer: Self-pay | Admitting: Internal Medicine

## 2019-02-15 DIAGNOSIS — Z1231 Encounter for screening mammogram for malignant neoplasm of breast: Secondary | ICD-10-CM

## 2019-03-28 ENCOUNTER — Other Ambulatory Visit: Payer: Self-pay

## 2019-03-28 ENCOUNTER — Ambulatory Visit
Admission: RE | Admit: 2019-03-28 | Discharge: 2019-03-28 | Disposition: A | Discharge status: Home / Self Care | Payer: Medicare Other | Source: Ambulatory Visit | Attending: Internal Medicine | Admitting: Internal Medicine

## 2019-03-28 DIAGNOSIS — Z1231 Encounter for screening mammogram for malignant neoplasm of breast: Secondary | ICD-10-CM | POA: Diagnosis present

## 2020-03-06 ENCOUNTER — Other Ambulatory Visit: Payer: Self-pay | Admitting: Internal Medicine

## 2020-03-06 DIAGNOSIS — Z1231 Encounter for screening mammogram for malignant neoplasm of breast: Secondary | ICD-10-CM

## 2020-03-08 ENCOUNTER — Emergency Department (HOSPITAL_COMMUNITY)
Admission: EM | Admit: 2020-03-08 | Discharge: 2020-03-08 | Disposition: A | Discharge status: Home / Self Care | Payer: Medicare PPO | Attending: Emergency Medicine | Admitting: Emergency Medicine

## 2020-03-08 ENCOUNTER — Encounter (HOSPITAL_COMMUNITY): Payer: Self-pay | Admitting: Emergency Medicine

## 2020-03-08 ENCOUNTER — Emergency Department (HOSPITAL_COMMUNITY): Payer: Medicare PPO

## 2020-03-08 DIAGNOSIS — Y999 Unspecified external cause status: Secondary | ICD-10-CM | POA: Insufficient documentation

## 2020-03-08 DIAGNOSIS — S82854A Nondisplaced trimalleolar fracture of right lower leg, initial encounter for closed fracture: Secondary | ICD-10-CM | POA: Diagnosis not present

## 2020-03-08 DIAGNOSIS — W010XXA Fall on same level from slipping, tripping and stumbling without subsequent striking against object, initial encounter: Secondary | ICD-10-CM | POA: Insufficient documentation

## 2020-03-08 DIAGNOSIS — X501XXA Overexertion from prolonged static or awkward postures, initial encounter: Secondary | ICD-10-CM | POA: Insufficient documentation

## 2020-03-08 DIAGNOSIS — S99911A Unspecified injury of right ankle, initial encounter: Secondary | ICD-10-CM | POA: Diagnosis present

## 2020-03-08 DIAGNOSIS — I10 Essential (primary) hypertension: Secondary | ICD-10-CM | POA: Diagnosis not present

## 2020-03-08 DIAGNOSIS — Y9301 Activity, walking, marching and hiking: Secondary | ICD-10-CM | POA: Insufficient documentation

## 2020-03-08 DIAGNOSIS — W19XXXA Unspecified fall, initial encounter: Secondary | ICD-10-CM

## 2020-03-08 DIAGNOSIS — E119 Type 2 diabetes mellitus without complications: Secondary | ICD-10-CM | POA: Diagnosis not present

## 2020-03-08 DIAGNOSIS — Y929 Unspecified place or not applicable: Secondary | ICD-10-CM | POA: Diagnosis not present

## 2020-03-08 DIAGNOSIS — R40241 Glasgow coma scale score 13-15, unspecified time: Secondary | ICD-10-CM | POA: Insufficient documentation

## 2020-03-08 DIAGNOSIS — S82851A Displaced trimalleolar fracture of right lower leg, initial encounter for closed fracture: Secondary | ICD-10-CM

## 2020-03-08 HISTORY — DX: Pure hypercholesterolemia, unspecified: E78.00

## 2020-03-08 LAB — I-STAT CHEM 8, ED
BUN: 31 mg/dL — ABNORMAL HIGH (ref 8–23)
Calcium, Ion: 1.22 mmol/L (ref 1.15–1.40)
Chloride: 100 mmol/L (ref 98–111)
Creatinine, Ser: 1.7 mg/dL — ABNORMAL HIGH (ref 0.44–1.00)
Glucose, Bld: 144 mg/dL — ABNORMAL HIGH (ref 70–99)
HCT: 39 % (ref 36.0–46.0)
Hemoglobin: 13.3 g/dL (ref 12.0–15.0)
Potassium: 4.2 mmol/L (ref 3.5–5.1)
Sodium: 138 mmol/L (ref 135–145)
TCO2: 26 mmol/L (ref 22–32)

## 2020-03-08 LAB — CBC
HCT: 40 % (ref 36.0–46.0)
Hemoglobin: 12.4 g/dL (ref 12.0–15.0)
MCH: 27.5 pg (ref 26.0–34.0)
MCHC: 31 g/dL (ref 30.0–36.0)
MCV: 88.7 fL (ref 80.0–100.0)
Platelets: 225 10*3/uL (ref 150–400)
RBC: 4.51 MIL/uL (ref 3.87–5.11)
RDW: 13.9 % (ref 11.5–15.5)
WBC: 7.1 10*3/uL (ref 4.0–10.5)
nRBC: 0 % (ref 0.0–0.2)

## 2020-03-08 LAB — BASIC METABOLIC PANEL
Anion gap: 8 (ref 5–15)
BUN: 29 mg/dL — ABNORMAL HIGH (ref 8–23)
CO2: 26 mmol/L (ref 22–32)
Calcium: 9.2 mg/dL (ref 8.9–10.3)
Chloride: 102 mmol/L (ref 98–111)
Creatinine, Ser: 1.56 mg/dL — ABNORMAL HIGH (ref 0.44–1.00)
GFR calc Af Amer: 39 mL/min — ABNORMAL LOW (ref >60)
GFR calc non Af Amer: 34 mL/min — ABNORMAL LOW (ref >60)
Glucose, Bld: 146 mg/dL — ABNORMAL HIGH (ref 70–99)
Potassium: 4.3 mmol/L (ref 3.5–5.1)
Sodium: 136 mmol/L (ref 135–145)

## 2020-03-08 LAB — PROTIME-INR
INR: 1 (ref 0.8–1.2)
Prothrombin Time: 13.1 seconds (ref 11.4–15.2)

## 2020-03-08 MED ORDER — PROPOFOL 10 MG/ML IV BOLUS
INTRAVENOUS | Status: AC
  Filled 2020-03-08: qty 40

## 2020-03-08 MED ORDER — OXYCODONE-ACETAMINOPHEN 5-325 MG PO TABS
1.0000 | ORAL_TABLET | Freq: Once | ORAL | Status: AC
  Administered 2020-03-08: 1 via ORAL
  Filled 2020-03-08: qty 1

## 2020-03-08 MED ORDER — PROPOFOL 10 MG/ML IV BOLUS
0.5000 mg/kg | Freq: Once | INTRAVENOUS | Status: AC
  Administered 2020-03-08: 50 mg via INTRAVENOUS

## 2020-03-08 MED ORDER — HYDROCODONE-ACETAMINOPHEN 5-325 MG PO TABS: 1.0000 | ORAL_TABLET | ORAL | 0 refills | Status: DC | PRN

## 2020-03-08 MED ORDER — SODIUM CHLORIDE 0.9 % IV SOLN
INTRAVENOUS | Status: AC | PRN
  Administered 2020-03-08: 500 mL via INTRAVENOUS

## 2020-03-08 MED ORDER — ONDANSETRON HCL 4 MG PO TABS
4.0000 mg | ORAL_TABLET | Freq: Three times a day (TID) | ORAL | 0 refills | Status: DC | PRN
Start: 2020-03-08 — End: 2020-03-12

## 2020-03-08 NOTE — Progress Notes (Signed)
Orthopedic Tech Progress Note Patient Details:  Krystal Hoffman 05-May-1951 [id number]  Ortho Devices Type of Ortho Device: Post (short leg) splint, Stirrup splint Ortho Device/Splint Location: rle. applied post reduction. added padding around ankle. Ortho Device/Splint Interventions: Ordered, Application, Adjustment   Post Interventions Patient Tolerated: Well Instructions Provided: Care of device, Adjustment of device   [name] 03/08/2020, 2:50 AM

## 2020-03-08 NOTE — Discharge Planning (Signed)
Duy Lemming J. Lucretia Roers, RN, BSN, [place] [phone] RNCM spoke with pt at bedside regarding discharge planning for Home Health Services. Offered pt medicare.gov list of home health agencies to choose from.  Pt chose [facility] to render services. Tiffany of [facility]  notified. Patient made aware that [facility]  will be in contact in 24-48 hours.  DME needs identified at this time include bed side commode and crutches. Bedside commode will be delivered to pt home and crutches will be given to pt prior to discharge.

## 2020-03-08 NOTE — Discharge Planning (Signed)
Transition of Care [facility] - Emergency Department Mini Assessment   Patient Details  Name: Krystal Hoffman MRN: [id number] Date of Birth: 1951/01/12  Transition of [facility] CM/SW Contact:    Oletta Cohn, RN Phone Number: 03/08/2020, 8:52 AM   Clinical Narrative: RNCM consulted regarding home health and home equipment needs.   ED Mini Assessment: What brought you to the Emergency Department? : broken ankle  Barriers to Discharge: Barriers Resolved  Barrier interventions: Home Health arranged, DME ordered  Means of departure: Hospital Transport  Interventions which prevented an admission or readmission: Medication Review, Home Health Consult or Services, DME Provided    Patient Contact and Communications        ,          Patient states their goals for this hospitalization and ongoing recovery are:: go home      Admission diagnosis:  LEVEL 2 There are no problems to display for this patient.  PCP:  Rafael Bihari, MD Pharmacy:   [facility] [phone] - [place],  - [address] [place] [address] Phone: [phone] Fax: [phone]

## 2020-03-08 NOTE — ED Provider Notes (Signed)
[facility] EMERGENCY DEPARTMENT Provider Note   CSN: [id number] Arrival date & time: 03/08/20  0134     History Chief Complaint  Patient presents with  . Fall    Krystal Hoffman is a 69 y.o. female.  Patient brought to the emergency department for evaluation of ankle injury.  Patient was initially labeled a level 2 trauma because it was thought that the patient had an open fracture without pulses.  Patient reports that she was walking down a step and tripped, twisting her ankle.  Patient presents with moderate to severe pain in the right ankle.  She denies any other injury.  She did not hit her head.  No neck or back pain.  She did not pass out.        Past Medical History:  Diagnosis Date  . Diabetes mellitus without complication (HCC)   . High cholesterol   . Hypertension     There are no problems to display for this patient.   History reviewed. No pertinent surgical history.   OB History   No obstetric history on file.     No family history on file.  Social History   Tobacco Use  . Smoking status: Never Smoker  . Smokeless tobacco: Never Used  Substance Use Topics  . Alcohol use: Not Currently    Comment: Socially   . Drug use: Not Currently    Home Medications Prior to Admission medications   Not on File    Allergies    Patient has no known allergies.  Review of Systems   Review of Systems  Musculoskeletal: Positive for arthralgias.  All other systems reviewed and are negative.   Physical Exam Updated Vital Signs BP 136/83 (BP Location: Left Arm)   Pulse 72   Temp 97.6 F (36.4 C) (Oral)   Resp (!) 24   Ht 5\' 7"  (1.702 m)   Wt 113.4 kg   SpO2 96%   BMI 39.16 kg/m   Physical Exam Vitals and nursing note reviewed.  Constitutional:      General: She is not in acute distress.    Appearance: Normal appearance. She is well-developed.  HENT:     Head: Normocephalic and atraumatic.     Right Ear: Hearing normal.      Left Ear: Hearing normal.     Nose: Nose normal.  Eyes:     Conjunctiva/sclera: Conjunctivae normal.     Pupils: Pupils are equal, round, and reactive to light.  Cardiovascular:     Rate and Rhythm: Regular rhythm.     Heart sounds: S1 normal and S2 normal. No murmur heard.  No friction rub. No gallop.   Pulmonary:     Effort: Pulmonary effort is normal. No respiratory distress.     Breath sounds: Normal breath sounds.  Chest:     Chest wall: No tenderness.  Abdominal:     General: Bowel sounds are normal.     Palpations: Abdomen is soft.     Tenderness: There is no abdominal tenderness. There is no guarding or rebound. Negative signs include Murphy's sign and McBurney's sign.     Hernia: No hernia is present.  Musculoskeletal:     Cervical back: Normal range of motion and neck supple.     Right ankle: Deformity present. No lacerations. Tenderness present. Decreased range of motion.  Skin:    General: Skin is warm and dry.     Findings: No rash.  Neurological:  Mental Status: She is alert and oriented to person, place, and time.     GCS: GCS eye subscore is 4. GCS verbal subscore is 5. GCS motor subscore is 6.     Cranial Nerves: No cranial nerve deficit.     Sensory: No sensory deficit.     Coordination: Coordination normal.  Psychiatric:        Speech: Speech normal.        Behavior: Behavior normal.        Thought Content: Thought content normal.     ED Results / Procedures / Treatments   Labs (all labs ordered are listed, but only abnormal results are displayed) Labs Reviewed  I-STAT CHEM 8, ED - Abnormal; Notable for the following components:      Result Value   BUN 31 (*)    Creatinine, Ser 1.70 (*)    Glucose, Bld 144 (*)    All other components within normal limits  CBC  PROTIME-INR  BASIC METABOLIC PANEL    EKG None  Radiology DG Ankle 2 Views Right  Result Date: 03/08/2020 CLINICAL DATA:  Recent fall with ankle pain, initial encounter EXAM: RIGHT  ANKLE - 2 VIEW COMPARISON:  None. FINDINGS: Oblique fracture through the distal fibula is noted. Fracture of the medial malleolus is seen. The talus is laterally dislocated with respect to the distal tibia. The medial malleolar and lateral malleolar fragments are displaced laterally as well with the talus. No other focal abnormality is noted. IMPRESSION: Distal tibial and fibular fractures with lateral dislocation of the talus with respect to the distal tibia. The distal fracture fragments are displaced laterally as well. Electronically Signed   By: Alcide Clever M.D.   On: 03/08/2020 01:55   DG Ankle Right Port  Result Date: 03/08/2020 CLINICAL DATA:  Post reduction EXAM: PORTABLE RIGHT ANKLE - 2 VIEW COMPARISON:  Same-day radiograph FINDINGS: Overlying splinting obscures fine bone and soft-tissue detail. 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 There is markedly improved alignment post reduction with more normal location of the talus albeit with some persistent lateral talar shift. Circumferential soft tissue swelling is noted. IMPRESSION: Trimalleolar fracture in a pattern compatible with a Weber B stage IV injury, with markedly improved alignment post reduction. Minimal residual fracture displacement and lateral talar shift. Electronically Signed   By: Kreg Shropshire M.D.   On: 03/08/2020 02:23    Procedures .Sedation  Date/Time: 03/08/2020 2:50 AM Performed by: Gilda Crease, MD Authorized by: Gilda Crease, MD   Consent:    Consent obtained:  Verbal   Consent given by:  Patient   Risks discussed:  Allergic reaction, dysrhythmia, inadequate sedation, nausea, prolonged hypoxia resulting in organ damage, prolonged sedation necessitating reversal, respiratory compromise  necessitating ventilatory assistance and intubation and vomiting   Alternatives discussed:  Analgesia without sedation, anxiolysis and regional anesthesia Universal protocol:    Procedure explained and questions answered to patient or proxy's satisfaction: yes     Relevant documents present and verified: yes     Test results available and properly labeled: yes     Imaging studies available: yes     Required blood products, implants, devices, and special equipment available: yes     Site/side marked: yes     Immediately prior to procedure a time out was called:  yes     Patient identity confirmation method:  Verbally with patient Indications:    Procedure necessitating sedation performed by:  Physician performing sedation Pre-sedation assessment:    Time since last food or drink:  5   ASA classification: class 1 - normal, healthy patient     Neck mobility: normal     Mouth opening:  3 or more finger widths   Thyromental distance:  4 finger widths   Mallampati score:  I - soft palate, uvula, fauces, pillars visible   Pre-sedation assessments completed and reviewed: airway patency, cardiovascular function, hydration status, mental status, nausea/vomiting, pain level, respiratory function and temperature   Immediate pre-procedure details:    Reassessment: Patient reassessed immediately prior to procedure     Reviewed: vital signs, relevant labs/tests and NPO status     Verified: bag valve mask available, emergency equipment available, intubation equipment available, IV patency confirmed, oxygen available and suction available   Procedure details (see MAR for exact dosages):    Preoxygenation:  Nasal cannula   Sedation:  Propofol   Intended level of sedation: deep   Intra-procedure monitoring:  Blood pressure monitoring, cardiac monitor, continuous pulse oximetry, frequent LOC assessments, frequent vital sign checks and continuous capnometry   Intra-procedure events: none     Total Provider  sedation time (minutes):  15 Post-procedure details:    Attendance: Constant attendance by certified staff until patient recovered     Recovery: Patient returned to pre-procedure baseline     Post-sedation assessments completed and reviewed: airway patency, cardiovascular function, hydration status, mental status, nausea/vomiting, pain level, respiratory function and temperature     Patient is stable for discharge or admission: yes     Patient tolerance:  Tolerated well, no immediate complications .Ortho Injury Treatment  Date/Time: 03/08/2020 2:50 AM Performed by: Gilda Crease, MD Authorized by: Gilda Crease, MD   Consent:    Consent obtained:  Verbal   Consent given by:  Patient   Risks discussed:  Nerve damage, restricted joint movement, vascular damage and irreducible dislocation Universal protocol:    Procedure explained and questions answered to patient or proxy's satisfaction: yes     Relevant documents present and verified: yes     Test results available and properly labeled: yes     Imaging studies available: yes     Required blood products, implants, devices, and special equipment available: yes     Site/side marked: yes     Immediately prior to procedure a time out was called: yes     Patient identity confirmed:  Verbally with patientInjury location: ankle Location details: right ankle Injury type: fracture-dislocation Pre-procedure neurovascular assessment: neurovascularly intact Pre-procedure distal perfusion: normal Pre-procedure neurological function: normal Pre-procedure range of motion: reduced  Anesthesia: Local anesthesia used: no  Patient sedated: Yes. Refer to sedation procedure documentation for details of sedation. Manipulation performed: yes Skeletal traction used: yes Reduction successful: yes X-ray confirmed reduction: yes Immobilization: splint Splint type: short leg and ankle stirrup Supplies used: cotton padding and  Ortho-Glass Post-procedure neurovascular assessment: post-procedure neurovascularly intact Post-procedure distal perfusion: normal Post-procedure neurological function: normal Post-procedure range of motion: improved Patient tolerance: patient tolerated the procedure well with no immediate complications    (including critical care time)  Medications Ordered in ED Medications  propofol (DIPRIVAN) 10 mg/mL bolus/IV push 0.5 mg/kg (50 mg Intravenous Given 03/08/20 0153)  0.9 %  sodium chloride infusion (500 mLs Intravenous New Bag/Given 03/08/20 0211)    ED Course  I have  reviewed the triage vital signs and the nursing notes.  Pertinent labs & imaging results that were available during my care of the patient were reviewed by me and considered in my medical decision making (see chart for details).    MDM Rules/Calculators/A&P                          Patient presents with isolated right ankle injury after a mechanical fall.  EMS report absence of pulses but at arrival to the emergency department she has an easily identifiable dorsalis pedal pulse.  There is no obvious deformity with lateral dislocation and fracture of the ankle.  This is not an open fracture.  Patient was immediately sedated and reduced.  Splinting was performed by [employer], she is neurovascularly intact after splinting.  Gust with Dr.Xu, on-call for orthopedics.  He requested a CT scan to facilitate surgical repair.  This will be performed.  He will see the patient in the office Tuesday to schedule surgical repair.  Final Clinical Impression(s) / ED Diagnoses Final diagnoses:  Fall  Closed trimalleolar fracture of right ankle, initial encounter    Rx / DC Orders ED Discharge Orders    None       Bartlett Enke, Canary Brimhristopher J, MD 03/08/20 [phone]

## 2020-03-08 NOTE — Progress Notes (Signed)
   03/08/20 0140  Clinical Encounter Type  Visited With Health care provider  Visit Type ED;Trauma  Referral From Nurse  Consult/Referral To Chaplain   Chaplain responded to level two trauma. No family present. Medical team evaluating pt. Chaplain remains available for support as needs arise.   Chaplain Resident, Amado Coe, M Div [phone] on-call pager

## 2020-03-08 NOTE — ED Triage Notes (Signed)
BIB EMS from home as Lvl 2 Trauma. Tripped and fell down 1 step rolling R ankle. Obvious deformity to R ankle. Per EMS foot pulseless and cold to touch. Pulse bounding upon arrival to ED. Denies hitting head, denies LOC. VSS. GCS 15

## 2020-03-08 NOTE — Discharge Instructions (Addendum)
Your ankle will require surgery.  Please call Dr. Warren Danes office today.  He wants to see you in the office on Tuesday to schedule surgery.  Do not put any weight on your right leg.

## 2020-03-08 NOTE — ED Notes (Signed)
Called [facility] for transport home.  

## 2020-03-09 ENCOUNTER — Other Ambulatory Visit: Payer: Self-pay

## 2020-03-09 DIAGNOSIS — L900 Lichen sclerosus et atrophicus: Secondary | ICD-10-CM | POA: Diagnosis present

## 2020-03-09 DIAGNOSIS — I129 Hypertensive chronic kidney disease with stage 1 through stage 4 chronic kidney disease, or unspecified chronic kidney disease: Secondary | ICD-10-CM | POA: Diagnosis present

## 2020-03-09 DIAGNOSIS — Y92000 Kitchen of unspecified non-institutional (private) residence as  the place of occurrence of the external cause: Secondary | ICD-10-CM

## 2020-03-09 DIAGNOSIS — Z6841 Body Mass Index (BMI) 40.0 and over, adult: Secondary | ICD-10-CM

## 2020-03-09 DIAGNOSIS — E785 Hyperlipidemia, unspecified: Secondary | ICD-10-CM | POA: Diagnosis present

## 2020-03-09 DIAGNOSIS — Z888 Allergy status to other drugs, medicaments and biological substances status: Secondary | ICD-10-CM

## 2020-03-09 DIAGNOSIS — K219 Gastro-esophageal reflux disease without esophagitis: Secondary | ICD-10-CM | POA: Diagnosis present

## 2020-03-09 DIAGNOSIS — Z20822 Contact with and (suspected) exposure to covid-19: Secondary | ICD-10-CM | POA: Diagnosis present

## 2020-03-09 DIAGNOSIS — S82851D Displaced trimalleolar fracture of right lower leg, subsequent encounter for closed fracture with routine healing: Secondary | ICD-10-CM | POA: Diagnosis not present

## 2020-03-09 DIAGNOSIS — N1832 Chronic kidney disease, stage 3b: Secondary | ICD-10-CM | POA: Diagnosis present

## 2020-03-09 DIAGNOSIS — S82851A Displaced trimalleolar fracture of right lower leg, initial encounter for closed fracture: Secondary | ICD-10-CM | POA: Diagnosis not present

## 2020-03-09 DIAGNOSIS — W010XXD Fall on same level from slipping, tripping and stumbling without subsequent striking against object, subsequent encounter: Secondary | ICD-10-CM | POA: Diagnosis present

## 2020-03-09 DIAGNOSIS — E559 Vitamin D deficiency, unspecified: Secondary | ICD-10-CM | POA: Diagnosis present

## 2020-03-09 DIAGNOSIS — Z79899 Other long term (current) drug therapy: Secondary | ICD-10-CM

## 2020-03-09 DIAGNOSIS — Z7984 Long term (current) use of oral hypoglycemic drugs: Secondary | ICD-10-CM

## 2020-03-09 DIAGNOSIS — F329 Major depressive disorder, single episode, unspecified: Secondary | ICD-10-CM | POA: Diagnosis present

## 2020-03-09 DIAGNOSIS — E1122 Type 2 diabetes mellitus with diabetic chronic kidney disease: Secondary | ICD-10-CM | POA: Diagnosis present

## 2020-03-09 DIAGNOSIS — F419 Anxiety disorder, unspecified: Secondary | ICD-10-CM | POA: Diagnosis present

## 2020-03-09 LAB — CBC
HCT: 37.9 % (ref 36.0–46.0)
Hemoglobin: 12.4 g/dL (ref 12.0–15.0)
MCH: 27.6 pg (ref 26.0–34.0)
MCHC: 32.7 g/dL (ref 30.0–36.0)
MCV: 84.2 fL (ref 80.0–100.0)
Platelets: 206 10*3/uL (ref 150–400)
RBC: 4.5 MIL/uL (ref 3.87–5.11)
RDW: 13.9 % (ref 11.5–15.5)
WBC: 7.9 10*3/uL (ref 4.0–10.5)
nRBC: 0 % (ref 0.0–0.2)

## 2020-03-09 LAB — BASIC METABOLIC PANEL
Anion gap: 11 (ref 5–15)
BUN: 24 mg/dL — ABNORMAL HIGH (ref 8–23)
CO2: 26 mmol/L (ref 22–32)
Calcium: 9.1 mg/dL (ref 8.9–10.3)
Chloride: 100 mmol/L (ref 98–111)
Creatinine, Ser: 1.45 mg/dL — ABNORMAL HIGH (ref 0.44–1.00)
GFR calc Af Amer: 43 mL/min — ABNORMAL LOW (ref >60)
GFR calc non Af Amer: 37 mL/min — ABNORMAL LOW (ref >60)
Glucose, Bld: 148 mg/dL — ABNORMAL HIGH (ref 70–99)
Potassium: 4.5 mmol/L (ref 3.5–5.1)
Sodium: 137 mmol/L (ref 135–145)

## 2020-03-09 NOTE — ED Triage Notes (Signed)
Patient reports dx with compound right ankle fracture on Thursday. Patient is supposed to have surgery to fix ankle. Patient is unable to take care of herself at home. Patient c/o right ankle/leg pain.

## 2020-03-09 NOTE — ED Triage Notes (Signed)
FIRST NURSE NOTE:   Pt arrived via [facility] with reports of ankle fracture Thursday at [facility], unable to care for herself at home, wants ortho here.  Pt arrived in soiled clothes  BP 138/55, P-74, 96% RA, A/O x 4 per EMS  Hx DM, HTN.

## 2020-03-10 ENCOUNTER — Emergency Department: Payer: Medicare PPO

## 2020-03-10 ENCOUNTER — Inpatient Hospital Stay
Admission: EM | Admit: 2020-03-10 | Discharge: 2020-03-12 | DRG: 563 | Disposition: A | Discharge status: Skilled Nursing Facility | Payer: Medicare PPO | Attending: Internal Medicine | Admitting: Internal Medicine

## 2020-03-10 DIAGNOSIS — E1122 Type 2 diabetes mellitus with diabetic chronic kidney disease: Secondary | ICD-10-CM | POA: Diagnosis present

## 2020-03-10 DIAGNOSIS — E785 Hyperlipidemia, unspecified: Secondary | ICD-10-CM | POA: Diagnosis present

## 2020-03-10 DIAGNOSIS — S82851G Displaced trimalleolar fracture of right lower leg, subsequent encounter for closed fracture with delayed healing: Secondary | ICD-10-CM | POA: Diagnosis not present

## 2020-03-10 DIAGNOSIS — S82851A Displaced trimalleolar fracture of right lower leg, initial encounter for closed fracture: Secondary | ICD-10-CM | POA: Diagnosis present

## 2020-03-10 DIAGNOSIS — S82851D Displaced trimalleolar fracture of right lower leg, subsequent encounter for closed fracture with routine healing: Secondary | ICD-10-CM

## 2020-03-10 DIAGNOSIS — I10 Essential (primary) hypertension: Secondary | ICD-10-CM | POA: Diagnosis not present

## 2020-03-10 DIAGNOSIS — Z79899 Other long term (current) drug therapy: Secondary | ICD-10-CM | POA: Diagnosis not present

## 2020-03-10 DIAGNOSIS — L900 Lichen sclerosus et atrophicus: Secondary | ICD-10-CM | POA: Diagnosis present

## 2020-03-10 DIAGNOSIS — Z789 Other specified health status: Secondary | ICD-10-CM

## 2020-03-10 DIAGNOSIS — Z888 Allergy status to other drugs, medicaments and biological substances status: Secondary | ICD-10-CM | POA: Diagnosis not present

## 2020-03-10 DIAGNOSIS — Y92000 Kitchen of unspecified non-institutional (private) residence as  the place of occurrence of the external cause: Secondary | ICD-10-CM | POA: Diagnosis not present

## 2020-03-10 DIAGNOSIS — E559 Vitamin D deficiency, unspecified: Secondary | ICD-10-CM | POA: Diagnosis present

## 2020-03-10 DIAGNOSIS — Z20822 Contact with and (suspected) exposure to covid-19: Secondary | ICD-10-CM | POA: Diagnosis present

## 2020-03-10 DIAGNOSIS — I129 Hypertensive chronic kidney disease with stage 1 through stage 4 chronic kidney disease, or unspecified chronic kidney disease: Secondary | ICD-10-CM | POA: Diagnosis present

## 2020-03-10 DIAGNOSIS — N1832 Chronic kidney disease, stage 3b: Secondary | ICD-10-CM | POA: Diagnosis present

## 2020-03-10 DIAGNOSIS — S82891A Other fracture of right lower leg, initial encounter for closed fracture: Secondary | ICD-10-CM | POA: Diagnosis not present

## 2020-03-10 DIAGNOSIS — F419 Anxiety disorder, unspecified: Secondary | ICD-10-CM | POA: Diagnosis present

## 2020-03-10 DIAGNOSIS — Z7984 Long term (current) use of oral hypoglycemic drugs: Secondary | ICD-10-CM | POA: Diagnosis not present

## 2020-03-10 DIAGNOSIS — Z978 Presence of other specified devices: Secondary | ICD-10-CM

## 2020-03-10 DIAGNOSIS — F32A Depression, unspecified: Secondary | ICD-10-CM | POA: Diagnosis present

## 2020-03-10 DIAGNOSIS — K219 Gastro-esophageal reflux disease without esophagitis: Secondary | ICD-10-CM | POA: Diagnosis present

## 2020-03-10 DIAGNOSIS — Z6841 Body Mass Index (BMI) 40.0 and over, adult: Secondary | ICD-10-CM | POA: Diagnosis not present

## 2020-03-10 DIAGNOSIS — F329 Major depressive disorder, single episode, unspecified: Secondary | ICD-10-CM | POA: Diagnosis present

## 2020-03-10 DIAGNOSIS — N183 Chronic kidney disease, stage 3 unspecified: Secondary | ICD-10-CM | POA: Diagnosis not present

## 2020-03-10 DIAGNOSIS — W010XXD Fall on same level from slipping, tripping and stumbling without subsequent striking against object, subsequent encounter: Secondary | ICD-10-CM | POA: Diagnosis present

## 2020-03-10 LAB — URINALYSIS, ROUTINE W REFLEX MICROSCOPIC
Bilirubin Urine: NEGATIVE
Glucose, UA: NEGATIVE mg/dL
Hgb urine dipstick: NEGATIVE
Ketones, ur: NEGATIVE mg/dL
Leukocytes,Ua: NEGATIVE
Nitrite: NEGATIVE
Protein, ur: 30 mg/dL — AB
Specific Gravity, Urine: 1.014 (ref 1.005–1.030)
pH: 5 (ref 5.0–8.0)

## 2020-03-10 LAB — HEMOGLOBIN A1C
Hgb A1c MFr Bld: 5.8 % — ABNORMAL HIGH (ref 4.8–5.6)
Mean Plasma Glucose: 119.76 mg/dL

## 2020-03-10 LAB — SURGICAL PCR SCREEN
MRSA, PCR: NEGATIVE
Staphylococcus aureus: NEGATIVE

## 2020-03-10 LAB — PROTIME-INR
INR: 1.1 (ref 0.8–1.2)
Prothrombin Time: 14.2 seconds (ref 11.4–15.2)

## 2020-03-10 LAB — GLUCOSE, CAPILLARY
Glucose-Capillary: 101 mg/dL — ABNORMAL HIGH (ref 70–99)
Glucose-Capillary: 128 mg/dL — ABNORMAL HIGH (ref 70–99)
Glucose-Capillary: 122 mg/dL — ABNORMAL HIGH (ref 70–99)
Glucose-Capillary: 93 mg/dL (ref 70–99)

## 2020-03-10 LAB — HIV ANTIBODY (ROUTINE TESTING W REFLEX): HIV Screen 4th Generation wRfx: NONREACTIVE

## 2020-03-10 LAB — SARS CORONAVIRUS 2 BY RT PCR (HOSPITAL ORDER, PERFORMED IN ~~LOC~~ HOSPITAL LAB): SARS Coronavirus 2: NEGATIVE

## 2020-03-10 MED ORDER — ONDANSETRON HCL 4 MG/2ML IJ SOLN: 4.0000 mg | Freq: Four times a day (QID) | INTRAMUSCULAR | Status: DC | PRN

## 2020-03-10 MED ORDER — HYDROCODONE-ACETAMINOPHEN 5-325 MG PO TABS
1.0000 | ORAL_TABLET | ORAL | Status: DC | PRN
  Administered 2020-03-10: 1 via ORAL
  Filled 2020-03-10: qty 1

## 2020-03-10 MED ORDER — POLYETHYLENE GLYCOL 3350 17 G PO PACK
17.0000 g | PACK | Freq: Every day | ORAL | Status: DC | PRN
  Filled 2020-03-10 (×2): qty 1

## 2020-03-10 MED ORDER — SODIUM CHLORIDE 0.9% FLUSH: 3.0000 mL | INTRAVENOUS | Status: DC | PRN

## 2020-03-10 MED ORDER — MORPHINE SULFATE (PF) 2 MG/ML IV SOLN
1.0000 mg | INTRAVENOUS | Status: DC | PRN
  Administered 2020-03-10: 1 mg via INTRAVENOUS
  Filled 2020-03-10: qty 1

## 2020-03-10 MED ORDER — HYDROCHLOROTHIAZIDE 12.5 MG PO CAPS
12.5000 mg | ORAL_CAPSULE | Freq: Every day | ORAL | Status: DC
  Administered 2020-03-10 – 2020-03-11 (×2): 12.5 mg via ORAL
  Filled 2020-03-10 (×2): qty 1

## 2020-03-10 MED ORDER — PANTOPRAZOLE SODIUM 40 MG PO TBEC
40.0000 mg | DELAYED_RELEASE_TABLET | Freq: Every day | ORAL | Status: DC
  Administered 2020-03-10 – 2020-03-12 (×3): 40 mg via ORAL
  Filled 2020-03-10 (×3): qty 1

## 2020-03-10 MED ORDER — HYDROCODONE-ACETAMINOPHEN 5-325 MG PO TABS
1.0000 | ORAL_TABLET | Freq: Every day | ORAL | Status: DC | PRN
  Administered 2020-03-10 – 2020-03-12 (×8): 1 via ORAL
  Filled 2020-03-10 (×8): qty 1

## 2020-03-10 MED ORDER — SODIUM CHLORIDE 0.9 % IV SOLN: INTRAVENOUS | Status: DC

## 2020-03-10 MED ORDER — FLUOXETINE HCL 20 MG PO CAPS
20.0000 mg | ORAL_CAPSULE | Freq: Every day | ORAL | Status: DC
  Administered 2020-03-10 – 2020-03-12 (×3): 20 mg via ORAL
  Filled 2020-03-10 (×3): qty 1

## 2020-03-10 MED ORDER — ATORVASTATIN CALCIUM 10 MG PO TABS
10.0000 mg | ORAL_TABLET | Freq: Every day | ORAL | Status: DC
  Administered 2020-03-10 – 2020-03-12 (×3): 10 mg via ORAL
  Filled 2020-03-10 (×3): qty 1

## 2020-03-10 MED ORDER — LOSARTAN POTASSIUM 50 MG PO TABS
100.0000 mg | ORAL_TABLET | Freq: Every day | ORAL | Status: DC
  Administered 2020-03-10 – 2020-03-12 (×3): 100 mg via ORAL
  Filled 2020-03-10 (×3): qty 2

## 2020-03-10 MED ORDER — INSULIN ASPART 100 UNIT/ML ~~LOC~~ SOLN
0.0000 [IU] | Freq: Three times a day (TID) | SUBCUTANEOUS | Status: DC
  Administered 2020-03-10 – 2020-03-12 (×2): 3 [IU] via SUBCUTANEOUS
  Filled 2020-03-10 (×2): qty 1

## 2020-03-10 MED ORDER — LATANOPROST 0.005 % OP SOLN
1.0000 [drp] | Freq: Every day | OPHTHALMIC | Status: DC
  Administered 2020-03-10 – 2020-03-11 (×2): 1 [drp] via OPHTHALMIC
  Filled 2020-03-10: qty 2.5

## 2020-03-10 MED ORDER — ASPIRIN 325 MG PO TABS
325.0000 mg | ORAL_TABLET | Freq: Two times a day (BID) | ORAL | Status: DC
  Administered 2020-03-10 – 2020-03-12 (×4): 325 mg via ORAL
  Filled 2020-03-10 (×5): qty 1

## 2020-03-10 MED ORDER — SODIUM CHLORIDE 0.9 % IV SOLN: 250.0000 mL | INTRAVENOUS | Status: DC | PRN

## 2020-03-10 MED ORDER — ENOXAPARIN SODIUM 40 MG/0.4ML ~~LOC~~ SOLN
40.0000 mg | SUBCUTANEOUS | Status: DC
  Administered 2020-03-10 – 2020-03-11 (×2): 40 mg via SUBCUTANEOUS
  Filled 2020-03-10 (×2): qty 0.4

## 2020-03-10 MED ORDER — ONDANSETRON HCL 4 MG PO TABS: 4.0000 mg | ORAL_TABLET | Freq: Four times a day (QID) | ORAL | Status: DC | PRN

## 2020-03-10 MED ORDER — VITAMIN D (ERGOCALCIFEROL) 1.25 MG (50000 UNIT) PO CAPS: 50000.0000 [IU] | ORAL_CAPSULE | ORAL | Status: DC

## 2020-03-10 MED ORDER — OXYCODONE HCL 5 MG PO TABS: 5.0000 mg | ORAL_TABLET | ORAL | Status: DC | PRN

## 2020-03-10 MED ORDER — AMLODIPINE BESYLATE 10 MG PO TABS
10.0000 mg | ORAL_TABLET | Freq: Every day | ORAL | Status: DC
  Administered 2020-03-10 – 2020-03-12 (×3): 10 mg via ORAL
  Filled 2020-03-10 (×3): qty 1

## 2020-03-10 MED ORDER — SODIUM CHLORIDE 0.9% FLUSH
3.0000 mL | Freq: Two times a day (BID) | INTRAVENOUS | Status: DC
  Administered 2020-03-10 – 2020-03-12 (×2): 3 mL via INTRAVENOUS

## 2020-03-10 NOTE — ED Notes (Addendum)
Attempted to call report to Jun, RN, he asked that we call back in 5 minutes.

## 2020-03-10 NOTE — ED Provider Notes (Signed)
[facility] Emergency Department Provider Note  ____________________________________________  Time seen: Approximately 4:18 AM  I have reviewed the triage vital signs and the nursing notes.   HISTORY  Chief Complaint Self-Care problems   HPI Krystal Hoffman is a 69 y.o. female history of diabetes, hypertension, hyperlipidemia who presents from home for inability to care for self.  2 days ago patient had a fall and sustained a right trimalleolar fracture.  She was sent home to follow-up as an outpatient with Ortho for surgical repair.  Patient reports that she lives at home with her husband and she has been unable to care for herself.  She is unable to ambulate safely and her husband cannot help her.  Patient arrived to soiled to the emergency department.  She reports that she lives in [place] and wishes that her surgery be done here instead of [facility] which is where she was seen after her fall.  She is complaining of moderate throbbing constant pain that is worse with ambulation on her right ankle.   Past Medical History:  Diagnosis Date  . Anxiety   . Diabetes mellitus without complication (HCC)   . GERD (gastroesophageal reflux disease)   . Hypercholesteremia   . Hypertension   . Lichen sclerosus   . Vitamin D deficiency     Patient Active Problem List   Diagnosis Date Noted  . Lichen sclerosus 06/06/2015  . Class 3 obesity due to excess calories with body mass index (BMI) of 40.0 to 44.9 in adult 06/06/2015  . Menopause 06/06/2015  . Chronic kidney disease, stage II (mild) 10/31/2014  . Type 2 diabetes mellitus (HCC) 05/02/2014  . Acid reflux 05/02/2014  . HLD (hyperlipidemia) 05/02/2014  . BP (high blood pressure) 05/02/2014    Past Surgical History:  Procedure Laterality Date  . SHOULDER SURGERY     tear  . TUBAL LIGATION     1986    Prior to Admission medications   Medication Sig Start Date End Date Taking? Authorizing Provider    amLODipine (NORVASC) 10 MG tablet TAKE 1 TABLET DAILY (CALL FOR APPOINTMENT) 02/20/15   [provider]  atorvastatin (LIPITOR) 10 MG tablet TAKE 1 TABLET DAILY 04/02/15   [provider]  clobetasol ointment (TEMOVATE) 0.05 % Apply 1 application topically as needed. 08/11/16   Defrancesco, Prentice Docker, MD  FLUoxetine (PROZAC) 20 MG capsule TAKE 1 CAPSULE DAILY 02/27/15   [provider]  glimepiride (AMARYL) 4 MG tablet TAKE ONE-HALF (1/2) TABLET TWICE A DAY 11/08/14   [provider]  glucose blood (BAYER CONTOUR NEXT TEST) test strip USE TWICE A DAY 03/18/15   [provider]  hydrochlorothiazide (MICROZIDE) 12.5 MG capsule TAKE 1 CAPSULE DAILY 05/27/15   [provider]  losartan (COZAAR) 100 MG tablet TAKE 1 TABLET DAILY (CALL FOR APPOINTMENT, NEED COMPLETE PHYSICAL EXAM) 01/07/15   [provider]  Multiple Vitamins tablet Take by mouth.    [provider]  omeprazole (PRILOSEC) 20 MG capsule TAKE 1 CAPSULE DAILY 02/22/15   [provider]  sitaGLIPtin (JANUVIA) 100 MG tablet Take by mouth. 12/11/14   [provider]  Vitamin D, Ergocalciferol, (DRISDOL) 50000 UNITS CAPS capsule TAKE 1 CAPSULE TWICE WEEKLY 01/07/15   [provider]    Allergies Metformin  Family History  Problem Relation Age of Onset  . Lung cancer Mother   . Uterine cancer Mother   . Brain cancer Father   . Ovarian cancer Neg Hx   .  Drug abuse Neg Hx   . Heart disease Neg Hx   . Breast cancer Neg Hx   . Colon cancer Neg Hx   . Diabetes Neg Hx     Social History Social History   Tobacco Use  . Smoking status: Never Smoker  . Smokeless tobacco: Never Used  Vaping Use  . Vaping Use: Never used  Substance Use Topics  . Alcohol use: No  . Drug use: No    Review of Systems  Constitutional: Negative for fever. Eyes: Negative for visual changes. ENT: Negative for sore throat. Neck: No neck pain  Cardiovascular:  Negative for chest pain. Respiratory: Negative for shortness of breath. Gastrointestinal: Negative for abdominal pain, vomiting or diarrhea. Genitourinary: Negative for dysuria. Musculoskeletal: Negative for back pain. + R ankle pain Skin: Negative for rash. Neurological: Negative for headaches, weakness or numbness. Psych: No SI or HI  ____________________________________________   PHYSICAL EXAM:  VITAL SIGNS: ED Triage Vitals  Enc Vitals Group     BP 03/09/20 2102 (!) 149/57     Pulse Rate 03/09/20 2102 70     Resp 03/09/20 2102 18     Temp 03/09/20 2102 (!) 96.5 F (35.8 C)     Temp Source 03/09/20 2102 Oral     SpO2 03/09/20 2102 94 %     Weight 03/09/20 2102 250 lb (113.4 kg)     Height --      Head Circumference --      Peak Flow --      Pain Score 03/09/20 2111 8     Pain Loc --      Pain Edu? --      Excl. in GC? --     Constitutional: Alert and oriented. Well appearing and in no apparent distress. HEENT:      Head: Normocephalic and atraumatic.         Eyes: Conjunctivae are normal. Sclera is non-icteric.       Mouth/Throat: Mucous membranes are moist.       Neck: Supple with no signs of meningismus. Cardiovascular: Regular rate and rhythm. No murmurs, gallops, or rubs. Respiratory: Normal respiratory effort. Lungs are clear to auscultation bilaterally.  Gastrointestinal: Soft, non tender. Musculoskeletal: Splint in place on the right ankle with brisk capillary refill distally, warm extremity. Neurologic: Normal speech and language. Face is symmetric. Moving all extremities. No gross focal neurologic deficits are appreciated. Skin: Skin is warm, dry and intact. No rash noted. Psychiatric: Mood and affect are normal. Speech and behavior are normal.  ____________________________________________   LABS (all labs ordered are listed, but only abnormal results are displayed)  Labs Reviewed  BASIC METABOLIC PANEL - Abnormal; Notable for the following  components:      Result Value   Glucose, Bld 148 (*)    BUN 24 (*)    Creatinine, Ser 1.45 (*)    GFR calc non Af Amer 37 (*)    GFR calc Af Amer 43 (*)    All other components within normal limits  SARS CORONAVIRUS 2 BY RT PCR (HOSPITAL ORDER, PERFORMED IN [facility])  CBC  PROTIME-INR  URINALYSIS, ROUTINE W REFLEX MICROSCOPIC   ____________________________________________  EKG  none  ____________________________________________  RADIOLOGY  none  ____________________________________________   PROCEDURES  Procedure(s) performed: None Procedures Critical Care performed:  None ____________________________________________   INITIAL IMPRESSION / ASSESSMENT AND PLAN / ED COURSE  69 y.o. female history of diabetes, hypertension, hyperlipidemia who presents from home for inability  to care for self since sustaining a trimalar fracture 2 days ago.  Splint is in place.  Patient has distal brisk capillary refill and warm toes with no significant swelling.  Discussed with Dr. Hyacinth Meeker who recommended repeating x-rays and admitting patient to the hospitalist service for casting or surgical repair.  Labs for medical clearance with no acute findings.  Old medical records reviewed.  Discussed with Dr. Para March for admission.      _____________________________________________ Please note:  Patient was evaluated in Emergency Department today for the symptoms described in the history of present illness. Patient was evaluated in the context of the global COVID-19 pandemic, which necessitated consideration that the patient might be at risk for infection with the SARS-CoV-2 virus that causes COVID-19. Institutional protocols and algorithms that pertain to the evaluation of patients at risk for COVID-19 are in a state of rapid change based on information released by regulatory bodies including the CDC and federal and state organizations. These policies and algorithms were followed during  the patient's care in the ED.  Some ED evaluations and interventions may be delayed as a result of limited staffing during the pandemic.   [place] Controlled Substance Database was reviewed by me. ____________________________________________   FINAL CLINICAL IMPRESSION(S) / ED DIAGNOSES   Final diagnoses:  Closed trimalleolar fracture of right ankle with routine healing, subsequent encounter  Unable to care for self      NEW MEDICATIONS STARTED DURING THIS VISIT:  ED Discharge Orders    None       Note:  This document was prepared using Dragon voice recognition software and may include unintentional dictation errors.    Don Perking, [place], MD 03/10/20 [phone]

## 2020-03-10 NOTE — ED Notes (Addendum)
Pt transferred to stretcher in room. Pt cleaned of incontinent episode, pt dried and fresh pads placed underneath pt. External cath in place, pt given warm blanket and comfortable.  Pt c/o 10/10 pain.

## 2020-03-10 NOTE — H&P (Signed)
History and Physical    Krystal Hoffman IWP:[id number] DOB: [birth year] DOA: 03/10/2020  PCP: Gracelyn Nurse, MD   Patient coming from: Home  I have personally briefly reviewed patient's old medical records in [facility]  Chief Complaint: Right ankle pain  HPI: Krystal Hoffman is a 69 y.o. female with medical history significant for diabetes mellitus, hypertension and dyslipidemia who presents to the emergency room for evaluation of severe pain in her right ankle which she rates an 8 x 10 intensity at its worst.  Patient states she fell on 03/08/20 and sustained a right trimalleolar fracture.  She was seen in the emergency room at [facility] and discharged home to follow-up as an outpatient with orthopedic surgery for surgical repair.  Patient states that she has been immobile since her discharge and unable to care for herself.  She lives with her disabled husband who is unable to assist her.  Patient arrived to the emergency room soiled and reports inability to care for herself. Her fall was a mechanical fall and she denies feeling dizzy or lightheaded.  She states that she slid in her kitchen and fell.  She denies having any chest pain, shortness of breath, nausea, vomiting, fever, chills, no changes in her bowel habits or any urinary symptoms. Right ankle x-ray shows stable alignment of the displaced fractures of the distal fibula and medial malleolus.   ED Course: Patient is a 69 year old female who is status post fall and has a right trimalleolar fracture.  Patient is unable to care for herself and will be admitted to the hospital for further evaluation.    Review of Systems: As per HPI otherwise 10 point review of systems negative.    Past Medical History:  Diagnosis Date  . Anxiety   . Diabetes mellitus without complication (HCC)   . GERD (gastroesophageal reflux disease)   . Hypercholesteremia   . Hypertension   . Lichen sclerosus   . Vitamin D deficiency     Past  Surgical History:  Procedure Laterality Date  . SHOULDER SURGERY     tear  . TUBAL LIGATION     1986     reports that she has never smoked. She has never used smokeless tobacco. She reports that she does not drink alcohol and does not use drugs.  Allergies  Allergen Reactions  . Metformin Diarrhea    Family History  Problem Relation Age of Onset  . Lung cancer Mother   . Uterine cancer Mother   . Brain cancer Father   . Ovarian cancer Neg Hx   . Drug abuse Neg Hx   . Heart disease Neg Hx   . Breast cancer Neg Hx   . Colon cancer Neg Hx   . Diabetes Neg Hx      Prior to Admission medications   Medication Sig Start Date End Date Taking? Authorizing Provider  amLODipine (NORVASC) 10 MG tablet TAKE 1 TABLET DAILY (CALL FOR APPOINTMENT) 02/20/15  Yes [provider]  atorvastatin (LIPITOR) 10 MG tablet TAKE 1 TABLET DAILY 04/02/15  Yes [provider]  clobetasol ointment (TEMOVATE) 0.05 % Apply 1 application topically as needed. 08/11/16  Yes Defrancesco, Prentice Docker, MD  FLUoxetine (PROZAC) 20 MG capsule TAKE 1 CAPSULE DAILY 02/27/15  Yes [provider]  glimepiride (AMARYL) 4 MG tablet TAKE ONE-HALF (1/2) TABLET TWICE A DAY 11/08/14  Yes [provider]  hydrochlorothiazide (MICROZIDE) 12.5 MG capsule TAKE 1 CAPSULE DAILY 05/27/15  Yes  [provider]  HYDROcodone-acetaminophen (NORCO/VICODIN) 5-325 MG tablet Take 1 tablet by mouth 5 (five) times daily as needed. 03/08/20  Yes [provider]  latanoprost (XALATAN) 0.005 % ophthalmic solution Place 1 drop into both eyes at bedtime. 12/24/19  Yes [provider]  losartan (COZAAR) 100 MG tablet TAKE 1 TABLET DAILY (CALL FOR APPOINTMENT, NEED COMPLETE PHYSICAL EXAM) 01/07/15  Yes [provider]  Multiple Vitamins tablet Take by mouth.   Yes [provider]  omeprazole (PRILOSEC) 20 MG capsule TAKE 1 CAPSULE DAILY 02/22/15  Yes [provider]    ondansetron (ZOFRAN) 4 MG tablet Take 4 mg by mouth every 8 (eight) hours as needed. 03/08/20  Yes [provider]  sitaGLIPtin (JANUVIA) 100 MG tablet Take 100 mg by mouth daily.  12/11/14  Yes [provider]  Vitamin D, Ergocalciferol, (DRISDOL) 50000 UNITS CAPS capsule TAKE 1 CAPSULE TWICE WEEKLY 01/07/15  Yes [provider]  glucose blood (BAYER CONTOUR NEXT TEST) test strip USE TWICE A DAY 03/18/15   [provider]    Physical Exam: Vitals:   03/10/20 0600 03/10/20 0700 03/10/20 0730 03/10/20 0832  BP: (!) 158/72 (!) 128/106 (!) 144/54 (!) 159/84  Pulse: 63 65 (!) 57 63  Resp: 18   15  Temp:    99.3 F (37.4 C)  TempSrc:    Oral  SpO2: 93% (!) 89% 94% 98%  Weight:         Vitals:   03/10/20 0600 03/10/20 0700 03/10/20 0730 03/10/20 0832  BP: (!) 158/72 (!) 128/106 (!) 144/54 (!) 159/84  Pulse: 63 65 (!) 57 63  Resp: 18   15  Temp:    99.3 F (37.4 C)  TempSrc:    Oral  SpO2: 93% (!) 89% 94% 98%  Weight:        Constitutional: NAD, alert and oriented x 3 Eyes: PERRL, lids and conjunctivae normal ENMT: Mucous membranes are moist.  Neck: normal, supple, no masses, no thyromegaly Respiratory: clear to auscultation bilaterally, no wheezing, no crackles. Normal respiratory effort. No accessory muscle use.  Cardiovascular: Regular rate and rhythm, no murmurs / rubs / gallops. No extremity edema. 2+ pedal pulses. No carotid bruits.  Abdomen: no tenderness, no masses palpated. No hepatosplenomegaly. Bowel sounds positive.  Musculoskeletal: no clubbing / cyanosis. Decreased ROM right ankle Skin: no rashes, lesions, ulcers.  Neurologic: No gross focal neurologic deficit.  Generalized weakness Psychiatric: Normal mood and affect.   Labs on Admission: I have personally reviewed following labs and imaging studies  CBC: Recent Labs  Lab 03/09/20 2118  WBC 7.9  HGB 12.4  HCT 37.9  MCV 84.2  PLT 206   Basic Metabolic Panel: Recent Labs   Lab 03/09/20 2118  NA 137  K 4.5  CL 100  CO2 26  GLUCOSE 148*  BUN 24*  CREATININE 1.45*  CALCIUM 9.1   GFR: CrCl cannot be calculated (Unknown ideal weight.). Liver Function Tests: No results for input(s): AST, ALT, ALKPHOS, BILITOT, PROT, ALBUMIN in the last 168 hours. No results for input(s): LIPASE, AMYLASE in the last 168 hours. No results for input(s): AMMONIA in the last 168 hours. Coagulation Profile: Recent Labs  Lab 03/10/20 0536  INR 1.1   Cardiac Enzymes: No results for input(s): CKTOTAL, CKMB, CKMBINDEX, TROPONINI in the last 168 hours. BNP (last 3 results) No results for input(s): PROBNP in the last 8760 hours. HbA1C: No results for input(s): HGBA1C in the last 72 hours. CBG: Recent  Labs  Lab 03/10/20 0834  GLUCAP 101*   Lipid Profile: No results for input(s): CHOL, HDL, LDLCALC, TRIG, CHOLHDL, LDLDIRECT in the last 72 hours. Thyroid Function Tests: No results for input(s): TSH, T4TOTAL, FREET4, T3FREE, THYROIDAB in the last 72 hours. Anemia Panel: No results for input(s): VITAMINB12, FOLATE, FERRITIN, TIBC, IRON, RETICCTPCT in the last 72 hours. Urine analysis:    Component Value Date/Time   COLORURINE YELLOW (A) 03/10/2020 0435   APPEARANCEUR HAZY (A) 03/10/2020 0435   LABSPEC 1.014 03/10/2020 0435   PHURINE 5.0 03/10/2020 0435   GLUCOSEU NEGATIVE 03/10/2020 0435   HGBUR NEGATIVE 03/10/2020 0435   BILIRUBINUR NEGATIVE 03/10/2020 0435   KETONESUR NEGATIVE 03/10/2020 0435   PROTEINUR 30 (A) 03/10/2020 0435   NITRITE NEGATIVE 03/10/2020 0435   LEUKOCYTESUR NEGATIVE 03/10/2020 0435    Radiological Exams on Admission: DG Ankle Complete Right  Result Date: 03/10/2020 CLINICAL DATA:  RIGHT ankle fracture on Thursday. Evaluate alignment. EXAM: RIGHT ANKLE - COMPLETE 3+ VIEW COMPARISON:  Plain film of the RIGHT ankle dated 03/08/2020. FINDINGS: Casting in place. Again noted are displaced fractures of the distal fibula and medial malleolus, both  stable in alignment. Ankle mortise is stable in alignment. IMPRESSION: Stable alignment of the displaced fractures of the distal fibula and medial malleolus. Electronically Signed   By: Bary Richard M.D.   On: 03/10/2020 05:16    EKG: Independently reviewed.   Assessment/Plan Principal Problem:   Closed right trimalleolar fracture Active Problems:   Type 2 diabetes mellitus with stage 3 chronic kidney disease (HCC)   Hypertension   Obesity, Class III, BMI 40-49.9 (morbid obesity) (HCC)   Closed right ankle fracture   Depression    Closed right trimalleolar fracture Patient is status post mechanical fall Immobilize right lower extremity Pain control We will consult orthopedic surgery   Type 2 diabetes mellitus with complications of stage III chronic kidney disease Renal function is stable Maintain consistent carbohydrate diet Glycemic control with sliding scale coverage   Morbid obesity Complicates overall prognosis and care   Hypertension Continue HCTZ, amlodipine and Cozaar   Depression Continue Fluoxetine   DVT prophylaxis: Lovenox Code Status: Full code Family Communication: Greater than 50% of time was spent discussing patient's condition and plan of care with patient and her daughter at the bedside.  All questions and concerns have been addressed.  She verbalizes understanding and agrees with the plan. Disposition Plan: Back to previous home environment Consults called: Orthopedics    Cicily Bonano MD [facility]     03/10/2020, 9:36 AM

## 2020-03-10 NOTE — Consult Note (Signed)
ORTHOPAEDIC CONSULTATION  REQUESTING PHYSICIAN: Lucile Shutters, MD  Chief Complaint: Right ankle pain  HPI: Krystal Hoffman is a 69 y.o. female who complains of right ankle pain after a fall at home on Thursday night 03/07/2020.  She was seen at the [facility] emergency room where she was x-rayed and seen to have a trimalleolar fracture dislocation of the ankle.  This was reduced by the emergency room physician and splinted.  Postreduction x-rays showed virtually anatomic position of the fracture fragments and joint.  She was post to follow-up with an orthopedic surgeon in [place] this week but was unable to manage at home.  She could not get to the bathroom or get out of bed.  He came to the [facility] emergency room last night for management and care.  I have discussed treatment with the patient and her husband.  She is a severe diabetic.  We discussed nonsurgical option in a cast versus open reduction internal fixation after the swelling is gone down in the few days.  The reduction is stable enough that I believe that a cast treatment may be possible.  They would prefer to start with this type of treatment with the understanding that we may have to proceed to surgery at a later date if the fracture remains unstable.  We will plan on applying a cast tomorrow.  She will need to go to skilled nursing facility for PT and care since she can and her husband cannot manage at home.  Past Medical History:  Diagnosis Date  . Anxiety   . Diabetes mellitus without complication (HCC)   . GERD (gastroesophageal reflux disease)   . Hypercholesteremia   . Hypertension   . Lichen sclerosus   . Vitamin D deficiency    Past Surgical History:  Procedure Laterality Date  . SHOULDER SURGERY     tear  . TUBAL LIGATION     1986   Social History   Socioeconomic History  . Marital status: Married    Spouse name: Not on file  . Number of children: Not on file  . Years of education: Not on file  . Highest  education level: Not on file  Occupational History  . Not on file  Tobacco Use  . Smoking status: Never Smoker  . Smokeless tobacco: Never Used  Vaping Use  . Vaping Use: Never used  Substance and Sexual Activity  . Alcohol use: No  . Drug use: No  . Sexual activity: Yes    Birth control/protection: Post-menopausal  Other Topics Concern  . Not on file  Social History Narrative  . Not on file   Social Determinants of Health   Financial Resource Strain:   . Difficulty of Paying Living Expenses:   Food Insecurity:   . Worried About Programme researcher, broadcasting/film/video in the Last Year:   . Barista in the Last Year:   Transportation Needs:   . Freight forwarder (Medical):   Marland Kitchen Lack of Transportation (Non-Medical):   Physical Activity:   . Days of Exercise per Week:   . Minutes of Exercise per Session:   Stress:   . Feeling of Stress :   Social Connections:   . Frequency of Communication with Friends and Family:   . Frequency of Social Gatherings with Friends and Family:   . Attends Religious Services:   . Active Member of Clubs or Organizations:   . Attends Banker Meetings:   Marland Kitchen Marital Status:  Family History  Problem Relation Age of Onset  . Lung cancer Mother   . Uterine cancer Mother   . Brain cancer Father   . Ovarian cancer Neg Hx   . Drug abuse Neg Hx   . Heart disease Neg Hx   . Breast cancer Neg Hx   . Colon cancer Neg Hx   . Diabetes Neg Hx    Allergies  Allergen Reactions  . Metformin Diarrhea   Prior to Admission medications   Medication Sig Start Date End Date Taking? Authorizing Provider  amLODipine (NORVASC) 10 MG tablet TAKE 1 TABLET DAILY (CALL FOR APPOINTMENT) 02/20/15  Yes [provider]  atorvastatin (LIPITOR) 10 MG tablet TAKE 1 TABLET DAILY 04/02/15  Yes [provider]  clobetasol ointment (TEMOVATE) 0.05 % Apply 1 application topically as needed. 08/11/16  Yes Defrancesco, Prentice Docker, MD  FLUoxetine (PROZAC) 20  MG capsule TAKE 1 CAPSULE DAILY 02/27/15  Yes [provider]  glimepiride (AMARYL) 4 MG tablet TAKE ONE-HALF (1/2) TABLET TWICE A DAY 11/08/14  Yes [provider]  hydrochlorothiazide (MICROZIDE) 12.5 MG capsule TAKE 1 CAPSULE DAILY 05/27/15  Yes [provider]  HYDROcodone-acetaminophen (NORCO/VICODIN) 5-325 MG tablet Take 1 tablet by mouth 5 (five) times daily as needed. 03/08/20  Yes [provider]  latanoprost (XALATAN) 0.005 % ophthalmic solution Place 1 drop into both eyes at bedtime. 12/24/19  Yes [provider]  losartan (COZAAR) 100 MG tablet TAKE 1 TABLET DAILY (CALL FOR APPOINTMENT, NEED COMPLETE PHYSICAL EXAM) 01/07/15  Yes [provider]  Multiple Vitamins tablet Take by mouth.   Yes [provider]  omeprazole (PRILOSEC) 20 MG capsule TAKE 1 CAPSULE DAILY 02/22/15  Yes [provider]  ondansetron (ZOFRAN) 4 MG tablet Take 4 mg by mouth every 8 (eight) hours as needed. 03/08/20  Yes [provider]  sitaGLIPtin (JANUVIA) 100 MG tablet Take 100 mg by mouth daily.  12/11/14  Yes [provider]  Vitamin D, Ergocalciferol, (DRISDOL) 50000 UNITS CAPS capsule TAKE 1 CAPSULE TWICE WEEKLY 01/07/15  Yes [provider]  glucose blood (BAYER CONTOUR NEXT TEST) test strip USE TWICE A DAY 03/18/15   [provider]   DG Ankle Complete Right  Result Date: 03/10/2020 CLINICAL DATA:  RIGHT ankle fracture on Thursday. Evaluate alignment. EXAM: RIGHT ANKLE - COMPLETE 3+ VIEW COMPARISON:  Plain film of the RIGHT ankle dated 03/08/2020. FINDINGS: Casting in place. Again noted are displaced fractures of the distal fibula and medial malleolus, both stable in alignment. Ankle mortise is stable in alignment. IMPRESSION: Stable alignment of the displaced fractures of the distal fibula and medial malleolus. Electronically Signed   By: Bary Richard M.D.   On: 03/10/2020 05:16    Positive ROS: All other systems  have been reviewed and were otherwise negative with the exception of those mentioned in the HPI and as above.  Physical Exam: General: Alert, no acute distress Cardiovascular: No pedal edema Respiratory: No cyanosis, no use of accessory musculature GI: No organomegaly, abdomen is soft and non-tender Skin: No lesions in the area of chief complaint Neurologic: Sensation intact distally Psychiatric: Patient is competent for consent with normal mood and affect Lymphatic: No axillary or cervical lymphadenopathy  MUSCULOSKELETAL: Patient alert and fully oriented.  The right leg is in a well-padded posterior splint.  Neurovascular status good distally.  There is moderate swelling in the foot and toes.  Some bruising.  There is no bleeding.  No other injuries are noted.  Assessment: Reduced trimalleolar fracture dislocation right ankle  Plan: We will plan on cast application tomorrow. She will need skilled nursing facility. She will be nonweightbearing for extended period of time. Enteric-coated aspirin 1 p.o. twice daily for DVT prophylaxis would be preferred.    Valinda Hoar, MD [phone]   03/10/2020 2:53 PM

## 2020-03-11 ENCOUNTER — Inpatient Hospital Stay: Payer: Medicare PPO

## 2020-03-11 DIAGNOSIS — E1122 Type 2 diabetes mellitus with diabetic chronic kidney disease: Secondary | ICD-10-CM

## 2020-03-11 DIAGNOSIS — N183 Chronic kidney disease, stage 3 unspecified: Secondary | ICD-10-CM

## 2020-03-11 DIAGNOSIS — S82851G Displaced trimalleolar fracture of right lower leg, subsequent encounter for closed fracture with delayed healing: Secondary | ICD-10-CM

## 2020-03-11 LAB — BASIC METABOLIC PANEL
Anion gap: 6 (ref 5–15)
BUN: 29 mg/dL — ABNORMAL HIGH (ref 8–23)
CO2: 28 mmol/L (ref 22–32)
Calcium: 8.6 mg/dL — ABNORMAL LOW (ref 8.9–10.3)
Chloride: 107 mmol/L (ref 98–111)
Creatinine, Ser: 1.49 mg/dL — ABNORMAL HIGH (ref 0.44–1.00)
GFR calc Af Amer: 41 mL/min — ABNORMAL LOW (ref >60)
GFR calc non Af Amer: 36 mL/min — ABNORMAL LOW (ref >60)
Glucose, Bld: 113 mg/dL — ABNORMAL HIGH (ref 70–99)
Potassium: 4.2 mmol/L (ref 3.5–5.1)
Sodium: 141 mmol/L (ref 135–145)

## 2020-03-11 LAB — CBC
HCT: 35.1 % — ABNORMAL LOW (ref 36.0–46.0)
Hemoglobin: 11.6 g/dL — ABNORMAL LOW (ref 12.0–15.0)
MCH: 27.7 pg (ref 26.0–34.0)
MCHC: 33 g/dL (ref 30.0–36.0)
MCV: 83.8 fL (ref 80.0–100.0)
Platelets: 185 10*3/uL (ref 150–400)
RBC: 4.19 MIL/uL (ref 3.87–5.11)
RDW: 13.9 % (ref 11.5–15.5)
WBC: 5.2 10*3/uL (ref 4.0–10.5)
nRBC: 0 % (ref 0.0–0.2)

## 2020-03-11 LAB — GLUCOSE, CAPILLARY
Glucose-Capillary: 91 mg/dL (ref 70–99)
Glucose-Capillary: 93 mg/dL (ref 70–99)
Glucose-Capillary: 107 mg/dL — ABNORMAL HIGH (ref 70–99)
Glucose-Capillary: 114 mg/dL — ABNORMAL HIGH (ref 70–99)

## 2020-03-11 NOTE — Progress Notes (Signed)
Subjective:    Patient more comfortable today.  Pain is moderate.  She is agreeable to casting of the leg and going to skilled nursing for short time.  Procedure: The splint and dressings were removed.  The skin looked good except for an abrasion anteromedially.  There is no cellulitis.  Swelling was somewhat less than expected.  Neurovascular status was good distally. A well-padded short leg cast was applied with reduction forces.  We will get a repeat x-ray.  Patient reports pain as mild.  Objective:   VITALS:   Vitals:   03/11/20 0016 03/11/20 0804  BP: 122/74 (!) 147/75  Pulse: 61 65  Resp: 16 16  Temp: 98.9 F (37.2 C) 97.9 F (36.6 C)  SpO2: 95% 97%    Neurologically intact Neurovascular intact Sensation intact distally Intact pulses distally Dorsiflexion/Plantar flexion intact  LABS Recent Labs    03/09/20 2118 03/11/20 0514  HGB 12.4 11.6*  HCT 37.9 35.1*  WBC 7.9 5.2  PLT 206 185    Recent Labs    03/09/20 2118 03/11/20 0514  NA 137 141  K 4.5 4.2  BUN 24* 29*  CREATININE 1.45* 1.49*  GLUCOSE 148* 113*    Recent Labs    03/10/20 0536  INR 1.1     Assessment/Plan:      Up with therapy Discharge to SNF the patient is unable to care for self at home as demonstrated by the need for admission. Enteric-coated aspirin 3 and 25 mg twice daily Return to my office in 7 to 10 days for exam and x-ray. Patient needs skilled nursing care for a short time before going home.

## 2020-03-11 NOTE — TOC Initial Note (Signed)
Transition of Care [facility]) - Initial/Assessment Note    Patient Details  Name: Krystal Hoffman MRN: [id number] Date of Birth: Jun 23, 1951  [facility] CM/SW Contact:    Barrie Dunker, RN Phone Number: 03/11/2020, 12:36 PM  Clinical Narrative:                 Patient has a cast now and will need SNF for about a week, FL2, PASSR and bedsearch sent, will review offers once obtained        Patient Goals and CMS Choice        Expected Discharge Plan and Services                                                Prior Living Arrangements/Services                       Activities of Daily Living Home Assistive Devices/Equipment: None ADL Screening (condition at time of admission) Patient's cognitive ability adequate to safely complete daily activities?: Yes Is the patient deaf or have difficulty hearing?: No Does the patient have difficulty seeing, even when wearing glasses/contacts?: No Does the patient have difficulty concentrating, remembering, or making decisions?: No Patient able to express need for assistance with ADLs?: No Does the patient have difficulty dressing or bathing?: No Independently performs ADLs?: Yes (appropriate for developmental age) Does the patient have difficulty walking or climbing stairs?: Yes Weakness of Legs: Left Weakness of Arms/Hands: None  Permission Sought/Granted                  Emotional Assessment              Admission diagnosis:  Closed right trimalleolar fracture [S82.851A] Closed trimalleolar fracture of right ankle with routine healing, subsequent encounter [S82.851D] Unable to care for self [Z78.9] Closed right ankle fracture [S82.891A] Patient Active Problem List   Diagnosis Date Noted  . Closed right trimalleolar fracture 03/10/2020  . Obesity, Class III, BMI 40-49.9 (morbid obesity) (HCC) 03/10/2020  . Closed right ankle fracture 03/10/2020  . Depression   . Lichen sclerosus  06/06/2015  . Class 3 obesity due to excess calories with body mass index (BMI) of 40.0 to 44.9 in adult 06/06/2015  . Menopause 06/06/2015  . Chronic kidney disease, stage II (mild) 10/31/2014  . Type 2 diabetes mellitus with stage 3 chronic kidney disease (HCC) 05/02/2014  . Acid reflux 05/02/2014  . HLD (hyperlipidemia) 05/02/2014  . Hypertension 05/02/2014   PCP:  Gracelyn Nurse, MD Pharmacy:   [facility] [place] [place] - [address] [place], [address] [place], [address] [place] [address] Phone: [phone] Fax: [phone]  [facility] - [place], [place] - [address] [place] [address] Phone: [phone] Fax: [phone]  [id number] - [place], [place] - [address] [place] [address] Phone: [phone] Fax: [phone]     Social Determinants of Health (SDOH) Interventions    Readmission Risk Interventions No flowsheet data found.

## 2020-03-11 NOTE — Progress Notes (Signed)
PROGRESS NOTE    SHAKYA SEBRING  FVC:[id number] DOB: 1951/01/10 DOA: 03/10/2020 PCP: Gracelyn Nurse, MD    Brief Narrative:  KARIANNA Hoffman is a 69 y.o. female with medical history significant for diabetes mellitus, hypertension and dyslipidemia who presents to the emergency room for evaluation of severe pain in her right ankle which she rates an 8 x 10 intensity at its worst.  Patient states she fell on 03/08/20 and sustained a right trimalleolar fracture.  She was seen in the emergency room at [facility] and discharged home to follow-up as an outpatient with orthopedic surgery for surgical repair.  Patient states that she has been immobile since her discharge and unable to care for herself.  She lives with her disabled husband who is unable to assist her.  Patient arrived to the emergency room soiled and reports inability to care for herself. Her fall was a mechanical fall and she denies feeling dizzy or lightheaded.  She states that she slid in her kitchen and fell.  She denies having any chest pain, shortness of breath, nausea, vomiting, fever, chills, no changes in her bowel habits or any urinary symptoms. Right ankle x-ray shows stable alignment of the displaced fractures of the distal fibula and medial malleolus.    Consultants:   ortho  Procedures:   Antimicrobials:       Subjective: No complaints. Pain controlled.   Objective: Vitals:   03/10/20 0832 03/10/20 1549 03/11/20 0016 03/11/20 0804  BP: (!) 159/84 (!) 131/56 122/74 (!) 147/75  Pulse: 63 65 61 65  Resp: 15 16 16 16   Temp: 99.3 F (37.4 C) 98 F (36.7 C) 98.9 F (37.2 C) 97.9 F (36.6 C)  TempSrc: Oral Oral Oral Oral  SpO2: 98% 94% 95% 97%  Weight:        Intake/Output Summary (Last 24 hours) at 03/11/2020 0848 Last data filed at 03/11/2020 05/12/2020 Gross per 24 hour  Intake 248.31 ml  Output 550 ml  Net -301.69 ml   Filed Weights   03/09/20 2102  Weight: 113.4 kg    Examination:  General  exam: Appears calm and comfortable  Respiratory system: Clear to auscultation. Respiratory effort normal. Cardiovascular system: S1 & S2 heard, RRR. No JVD, murmurs, rubs, gallops or clicks.  Gastrointestinal system: Abdomen is nondistended, soft and nontender. No lt. Normal bowel sounds heard. Central nervous system: Alert and oriented. Grossly intact Extremities:no edema, RLE in semicast Skin: warm, dry Psychiatry: Judgement and insight appear normal. Mood & affect appropriate.     Data Reviewed: I have personally reviewed following labs and imaging studies  CBC: Recent Labs  Lab 03/08/20 0152 03/08/20 0201 03/09/20 2118 03/11/20 0514  WBC 7.1  --  7.9 5.2  HGB 12.4 13.3 12.4 11.6*  HCT 40.0 39.0 37.9 35.1*  MCV 88.7  --  84.2 83.8  PLT 225  --  206 185   Basic Metabolic Panel: Recent Labs  Lab 03/08/20 0152 03/08/20 0201 03/09/20 2118 03/11/20 0514  NA 136 138 137 141  K 4.3 4.2 4.5 4.2  CL 102 100 100 107  CO2 26  --  26 28  GLUCOSE 146* 144* 148* 113*  BUN 29* 31* 24* 29*  CREATININE 1.56* 1.70* 1.45* 1.49*  CALCIUM 9.2  --  9.1 8.6*   GFR: Estimated Creatinine Clearance: 46.9 mL/min (A) (by C-G formula based on SCr of 1.49 mg/dL (H)). Liver Function Tests: No results for input(s): AST, ALT, ALKPHOS, BILITOT, PROT, ALBUMIN in  the last 168 hours. No results for input(s): LIPASE, AMYLASE in the last 168 hours. No results for input(s): AMMONIA in the last 168 hours. Coagulation Profile: Recent Labs  Lab 03/08/20 0152 03/10/20 0536  INR 1.0 1.1   Cardiac Enzymes: No results for input(s): CKTOTAL, CKMB, CKMBINDEX, TROPONINI in the last 168 hours. BNP (last 3 results) No results for input(s): PROBNP in the last 8760 hours. HbA1C: Recent Labs    03/10/20 1050  HGBA1C 5.8*   CBG: Recent Labs  Lab 03/10/20 0834 03/10/20 1143 03/10/20 1551 03/10/20 2143 03/11/20 0803  GLUCAP 101* 128* 122* 93 91   Lipid Profile: No results for input(s): CHOL,  HDL, LDLCALC, TRIG, CHOLHDL, LDLDIRECT in the last 72 hours. Thyroid Function Tests: No results for input(s): TSH, T4TOTAL, FREET4, T3FREE, THYROIDAB in the last 72 hours. Anemia Panel: No results for input(s): VITAMINB12, FOLATE, FERRITIN, TIBC, IRON, RETICCTPCT in the last 72 hours. Sepsis Labs: No results for input(s): PROCALCITON, LATICACIDVEN in the last 168 hours.  Recent Results (from the past 240 hour(s))  SARS Coronavirus 2 by RT PCR (hospital order, performed in [facility] hospital lab) Nasopharyngeal Nasopharyngeal Swab     Status: None   Collection Time: 03/10/20  4:36 AM   Specimen: Nasopharyngeal Swab  Result Value Ref Range Status   SARS Coronavirus 2 NEGATIVE NEGATIVE Final    Comment: (NOTE) SARS-CoV-2 target nucleic acids are NOT DETECTED.  The SARS-CoV-2 RNA is generally detectable in upper and lower respiratory specimens during the acute phase of infection. The lowest concentration of SARS-CoV-2 viral copies this assay can detect is 250 copies / mL. A negative result does not preclude SARS-CoV-2 infection and should not be used as the sole basis for treatment or other patient management decisions.  A negative result may occur with improper specimen collection / handling, submission of specimen other than nasopharyngeal swab, presence of viral mutation(s) within the areas targeted by this assay, and inadequate number of viral copies (<250 copies / mL). A negative result must be combined with clinical observations, patient history, and epidemiological information.  Fact Sheet for Patients:   BoilerBrush.com.cy  Fact Sheet for Healthcare Providers: https://pope.com/  This test is not yet approved or  cleared by the [place] FDA and has been authorized for detection and/or diagnosis of SARS-CoV-2 by FDA under an Emergency Use Authorization (EUA).  This EUA will remain in effect (meaning this test can be used)  for the duration of the COVID-19 declaration under Section 564(b)(1) of the Act, 21 U.S.C. section 360bbb-3(b)(1), unless the authorization is terminated or revoked sooner.  Performed at [facility], [address] [place], [place] [address]   Surgical PCR screen     Status: None   Collection Time: 03/10/20 11:44 AM   Specimen: Nasal Mucosa; Nasal Swab  Result Value Ref Range Status   MRSA, PCR NEGATIVE NEGATIVE Final   Staphylococcus aureus NEGATIVE NEGATIVE Final    Comment: (NOTE) The Xpert SA Assay (FDA approved for NASAL specimens in patients 67 years of age and older), is one component of a comprehensive surveillance program. It is not intended to diagnose infection nor to guide or monitor treatment. Performed at [facility], [address] [place], [place] [address]          Radiology Studies: DG Ankle Complete Right  Result Date: 03/10/2020 CLINICAL DATA:  RIGHT ankle fracture on Thursday. Evaluate alignment. EXAM: RIGHT ANKLE - COMPLETE 3+ VIEW COMPARISON:  Plain film of the RIGHT ankle  dated 03/08/2020. FINDINGS: Casting in place. Again noted are displaced fractures of the distal fibula and medial malleolus, both stable in alignment. Ankle mortise is stable in alignment. IMPRESSION: Stable alignment of the displaced fractures of the distal fibula and medial malleolus. Electronically Signed   By: Bary Richard M.D.   On: 03/10/2020 05:16        Scheduled Meds: . amLODipine  10 mg Oral Daily  . aspirin  325 mg Oral BID  . atorvastatin  10 mg Oral Daily  . enoxaparin (LOVENOX) injection  40 mg Subcutaneous Q24H  . FLUoxetine  20 mg Oral Daily  . hydrochlorothiazide  12.5 mg Oral Daily  . insulin aspart  0-20 Units Subcutaneous TID WC  . latanoprost  1 drop Both Eyes QHS  . losartan  100 mg Oral Daily  . pantoprazole  40 mg Oral Daily  . sodium chloride flush  3 mL Intravenous Q12H  . [START ON 03/15/2020] Vitamin D (Ergocalciferol)   50,000 Units Oral Q7 days   Continuous Infusions: . sodium chloride 75 mL/hr at 03/11/20 0829  . sodium chloride      Assessment & Plan:   Principal Problem:   Closed right trimalleolar fracture Active Problems:   Type 2 diabetes mellitus with stage 3 chronic kidney disease (HCC)   Hypertension   Obesity, Class III, BMI 40-49.9 (morbid obesity) (HCC)   Closed right ankle fracture   Depression   Closed right trimalleolar fracture Patient is status post mechanical fall Ortho consulted Immobilize right lower extremity with casting by Dr. Hyacinth Meeker Will repeat xray Pain control    Type 2 diabetes mellitus with complications of stage III chronic kidney disease Renal function is stable Maintain consistent carbohydrate diet Glycemic control with sliding scale coverage   Morbid obesity Complicates overall prognosis and care   Hypertension Stable Continue HCTZ, amlodipine and Cozaar   Depression Continue Fluoxetine   DVT prophylaxis: Lovenox Code Status: Full Family Communication: None at bedside Disposition Plan: Back to previous home life Status is: Inpatient  Remains inpatient appropriate because:Ongoing diagnostic testing needed not appropriate for outpatient work up   Dispo: The patient is from: Home              Anticipated d/c is to: SNF              Anticipated d/c date is: 1 day              Patient currently is not medically stable to d/c. needs SNF            LOS: 1 day   Time spent:35 with more than 50% on COC    Lynn Ito, MD [facility] Pager [phone]  If 7PM-7AM, please contact night-coverage [email or web address] Password [facility] 03/11/2020, 8:48 AM

## 2020-03-11 NOTE — [place] FL2 (Signed)
MEDICAID FL2 LEVEL OF CARE SCREENING TOOL     IDENTIFICATION  Patient Name: Krystal Hoffman Birthdate: 1950/12/12 Sex: female Admission Date (Current Location): 03/10/2020  [place] and [place] Number:  Chiropodist and Address:  [facility], [address], [place], [place] [address]      Provider Number: [id number]  Attending Physician Name and Address:  Lynn Ito, MD  Relative Name and Phone Number:  Pete Glatter [phone]    Current Level of Care: Hospital Recommended Level of Care: Skilled Nursing Facility Prior Approval Number:    Date Approved/Denied:   PASRR Number: [id number]  Discharge Plan: SNF    Current Diagnoses: Patient Active Problem List   Diagnosis Date Noted  . Closed right trimalleolar fracture 03/10/2020  . Obesity, Class III, BMI 40-49.9 (morbid obesity) (HCC) 03/10/2020  . Closed right ankle fracture 03/10/2020  . Depression   . Lichen sclerosus 06/06/2015  . Class 3 obesity due to excess calories with body mass index (BMI) of 40.0 to 44.9 in adult 06/06/2015  . Menopause 06/06/2015  . Chronic kidney disease, stage II (mild) 10/31/2014  . Type 2 diabetes mellitus with stage 3 chronic kidney disease (HCC) 05/02/2014  . Acid reflux 05/02/2014  . HLD (hyperlipidemia) 05/02/2014  . Hypertension 05/02/2014    Orientation RESPIRATION BLADDER Height & Weight     Self, Time, Situation, Place  Normal Continent Weight: 113.4 kg Height:     BEHAVIORAL SYMPTOMS/MOOD NEUROLOGICAL BOWEL NUTRITION STATUS      Continent Diet (carb modified)  AMBULATORY STATUS COMMUNICATION OF NEEDS Skin   Extensive Assist Verbally Normal                       Personal Care Assistance Level of Assistance  Bathing, Dressing Bathing Assistance: Limited assistance   Dressing Assistance: Limited assistance     Functional Limitations Info             SPECIAL CARE FACTORS FREQUENCY  PT (By licensed PT), OT (By  licensed OT)     PT Frequency: 5 times per week OT Frequency: 3 days per week            Contractures Contractures Info: Not present    Additional Factors Info  Code Status, Insulin Sliding Scale Code Status Info: full code     Insulin Sliding Scale Info: regular sliding scale       Current Medications (03/11/2020):  This is the current hospital active medication list Current Facility-Administered Medications  Medication Dose Route Frequency Provider Last Rate Last Admin  . 0.9 %  sodium chloride infusion   Intravenous Continuous Deeann Saint, MD 75 mL/hr at 03/11/20 0829 New Bag at 03/11/20 0829  . 0.9 %  sodium chloride infusion  250 mL Intravenous PRN Agbata, Tochukwu, MD      . amLODipine (NORVASC) tablet 10 mg  10 mg Oral Daily Agbata, Tochukwu, MD   10 mg at 03/11/20 0825  . aspirin tablet 325 mg  325 mg Oral BID Deeann Saint, MD   325 mg at 03/11/20 0916  . atorvastatin (LIPITOR) tablet 10 mg  10 mg Oral Daily Agbata, Tochukwu, MD   10 mg at 03/11/20 3662  . enoxaparin (LOVENOX) injection 40 mg  40 mg Subcutaneous Q24H Agbata, Tochukwu, MD   40 mg at 03/10/20 2125  . FLUoxetine (PROZAC) capsule 20 mg  20 mg Oral Daily Agbata, Tochukwu, MD   20 mg at 03/11/20 0916  .  hydrochlorothiazide (MICROZIDE) capsule 12.5 mg  12.5 mg Oral Daily Agbata, Tochukwu, MD   12.5 mg at 03/11/20 0823  . HYDROcodone-acetaminophen (NORCO/VICODIN) 5-325 MG per tablet 1 tablet  1 tablet Oral 5 X Daily PRN Agbata, Tochukwu, MD   1 tablet at 03/11/20 0918  . insulin aspart (novoLOG) injection 0-20 Units  0-20 Units Subcutaneous TID WC Agbata, Tochukwu, MD   3 Units at 03/10/20 1646  . latanoprost (XALATAN) 0.005 % ophthalmic solution 1 drop  1 drop Both Eyes QHS Agbata, Tochukwu, MD   1 drop at 03/10/20 2126  . losartan (COZAAR) tablet 100 mg  100 mg Oral Daily Agbata, Tochukwu, MD   100 mg at 03/11/20 0824  . morphine 2 MG/ML injection 1 mg  1 mg Intravenous Q2H PRN Deeann Saint, MD   1 mg at  03/10/20 0450  . ondansetron (ZOFRAN) tablet 4 mg  4 mg Oral Q6H PRN Agbata, Tochukwu, MD       Or  . ondansetron (ZOFRAN) injection 4 mg  4 mg Intravenous Q6H PRN Agbata, Tochukwu, MD      . pantoprazole (PROTONIX) EC tablet 40 mg  40 mg Oral Daily Agbata, Tochukwu, MD   40 mg at 03/11/20 0823  . polyethylene glycol (MIRALAX / GLYCOLAX) packet 17 g  17 g Oral Daily PRN Agbata, Tochukwu, MD      . sodium chloride flush (NS) 0.9 % injection 3 mL  3 mL Intravenous Q12H Agbata, Tochukwu, MD   3 mL at 03/10/20 1013  . sodium chloride flush (NS) 0.9 % injection 3 mL  3 mL Intravenous PRN Agbata, Tochukwu, MD      . Melene Muller ON 03/15/2020] Vitamin D (Ergocalciferol) (DRISDOL) capsule 50,000 Units  50,000 Units Oral Q7 days Agbata, Tochukwu, MD         Discharge Medications: Please see discharge summary for a list of discharge medications.  Relevant Imaging Results:  Relevant Lab Results:   Additional Information SS# [id number]  Barrie Dunker, RN

## 2020-03-11 NOTE — Progress Notes (Signed)
Pt was admitted a result of fall. Low bed was supposed to be implemented but pt refused. Pt is alert and oriented.

## 2020-03-12 DIAGNOSIS — F329 Major depressive disorder, single episode, unspecified: Secondary | ICD-10-CM

## 2020-03-12 LAB — GLUCOSE, CAPILLARY
Glucose-Capillary: 121 mg/dL — ABNORMAL HIGH (ref 70–99)
Glucose-Capillary: 112 mg/dL — ABNORMAL HIGH (ref 70–99)

## 2020-03-12 MED ORDER — VITAMIN D (ERGOCALCIFEROL) 1.25 MG (50000 UNIT) PO CAPS: 50000.0000 [IU] | ORAL_CAPSULE | ORAL | Status: AC

## 2020-03-12 MED ORDER — POLYETHYLENE GLYCOL 3350 17 G PO PACK: 17.0000 g | PACK | Freq: Every day | ORAL | 0 refills | Status: DC | PRN

## 2020-03-12 MED ORDER — ASPIRIN EC 325 MG PO TBEC
325.0000 mg | DELAYED_RELEASE_TABLET | Freq: Two times a day (BID) | ORAL | 3 refills | Status: AC
Start: 2020-03-12 — End: 2021-03-12

## 2020-03-12 MED ORDER — ASPIRIN 325 MG PO TABS: 325.0000 mg | ORAL_TABLET | Freq: Two times a day (BID) | ORAL | Status: AC

## 2020-03-12 NOTE — Discharge Summary (Signed)
Krystal Hoffman XBJ:[id number] DOB: [birth year] DOA: 03/10/2020  PCP: Gracelyn Nurse, MD  Admit date: 03/10/2020 Discharge date: 03/12/2020  Admitted From: Home Disposition: SNF  Recommendations for Outpatient Follow-up:  1. Follow up with PCP in 1 week 2. Please obtain BMP/CBC in one week 3. Follow-up with Dr. Hyacinth Meeker orthopedics in 1 week     Discharge Condition:Stable CODE STATUS: Full Diet recommendation: Carb modified    Brief/Interim Summary: Krystal Janice Hydeis a 69 y.o.femalewith medical history significant fordiabetes mellitus, hypertension and dyslipidemia who presents to the emergency room for evaluation of severe pain in her right ankle which she rates an 8 x 10 intensity at its worst. Patient states she fell on 07/09/21and sustained a right trimalleolar fracture. She was seen in the emergency room at [facility] and discharged home to follow-up as an outpatient with orthopedic surgery for surgical repair. Patient states that she has been immobile since her discharge and unable to care for herself. She lives with her disabled husband who is unable to assist her. Patient arrived to the emergency room soiled and reports inability to care for herself. Her fall was a mechanical fall and she denies feeling dizzy or lightheaded. Right ankle x-ray showsstable alignment of the displaced fractures of the distal fibula and medial malleolus.   Closed right trimalleolar fracture Patient is status post mechanical fall Status post cast placed by Dr. Hyacinth Meeker Enteric-coated aspirin 325 mg twice daily Follow-up with Dr. 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 Renal function is stable Carb control Resume outpatient meds at discharge  Morbid obesity Complicates overall prognosis and care   Hypertension Stable Continue HCTZ, amlodipine and  Cozaar   Depression Continue Fluoxetine    Discharge Diagnoses:  Principal Problem:   Closed right trimalleolar fracture Active Problems:   Type 2 diabetes mellitus with stage 3 chronic kidney disease (HCC)   Hypertension   Obesity, Class III, BMI 40-49.9 (morbid obesity) (HCC)   Closed right ankle fracture   Depression    Discharge Instructions  Discharge Instructions    Call MD for:  severe uncontrolled pain   Complete by: As directed    Call MD for:  temperature >100.4   Complete by: As directed    Diet - low sodium heart healthy   Complete by: As directed    Discharge instructions   Complete by: As directed    F/u with pcp in one week F/u with Dr. Hyacinth Meeker in 7 days   Increase activity slowly   Complete by: As directed      Allergies as of 03/12/2020      Reactions   Metformin Diarrhea   Metformin And Related Diarrhea      Medication List    STOP taking these medications   clobetasol ointment 0.05 % Commonly known as: TEMOVATE   ondansetron 4 MG tablet Commonly known as: ZOFRAN     TAKE these medications   amLODipine 10 MG tablet Commonly known as: NORVASC TAKE 1 TABLET DAILY (CALL FOR APPOINTMENT) What changed: Another medication with the same name was removed. Continue taking this medication, and follow the directions you see here.   aspirin EC 325 MG tablet Take 1 tablet (325 mg total) by mouth in the morning and at bedtime.   aspirin 325 MG tablet Take 1 tablet (325 mg total) by mouth 2 (two) times daily.   atorvastatin  10 MG tablet Commonly known as: LIPITOR TAKE 1 TABLET DAILY What changed: Another medication with the same name was removed. Continue taking this medication, and follow the directions you see here.   Bayer Contour Next Test test strip Generic drug: glucose blood USE TWICE A DAY   FLUoxetine 20 MG capsule Commonly known as: PROZAC TAKE 1 CAPSULE DAILY What changed: Another medication with the same name was removed.  Continue taking this medication, and follow the directions you see here.   glimepiride 4 MG tablet Commonly known as: AMARYL TAKE ONE-HALF (1/2) TABLET TWICE A DAY What changed: Another medication with the same name was removed. Continue taking this medication, and follow the directions you see here.   hydrochlorothiazide 12.5 MG capsule Commonly known as: MICROZIDE TAKE 1 CAPSULE DAILY What changed: Another medication with the same name was removed. Continue taking this medication, and follow the directions you see here.   HYDROcodone-acetaminophen 5-325 MG tablet Commonly known as: NORCO/VICODIN Take 1 tablet by mouth every 4 (four) hours as needed for moderate pain. What changed: Another medication with the same name was removed. Continue taking this medication, and follow the directions you see here.   Januvia 100 MG tablet Generic drug: sitaGLIPtin Take 100 mg by mouth daily. What changed: Another medication with the same name was removed. Continue taking this medication, and follow the directions you see here.   latanoprost 0.005 % ophthalmic solution Commonly known as: XALATAN Place 1 drop into both eyes at bedtime. What changed: Another medication with the same name was removed. Continue taking this medication, and follow the directions you see here.   losartan 100 MG tablet Commonly known as: COZAAR TAKE 1 TABLET DAILY (CALL FOR APPOINTMENT, NEED COMPLETE PHYSICAL EXAM) What changed: Another medication with the same name was removed. Continue taking this medication, and follow the directions you see here.   multivitamin capsule Take 1 capsule by mouth daily. What changed: Another medication with the same name was removed. Continue taking this medication, and follow the directions you see here.   omeprazole 20 MG capsule Commonly known as: PRILOSEC TAKE 1 CAPSULE DAILY What changed: Another medication with the same name was removed. Continue taking this medication, and  follow the directions you see here.   polyethylene glycol 17 g packet Commonly known as: MIRALAX / GLYCOLAX Take 17 g by mouth daily as needed for mild constipation.   Vitamin D (Ergocalciferol) 1.25 MG (50000 UNIT) Caps capsule Commonly known as: DRISDOL Take 1 capsule (50,000 Units total) by mouth every 7 (seven) days. Start taking on: March 15, 2020 What changed:   See the new instructions.  Another medication with the same name was removed. Continue taking this medication, and follow the directions you see here.       Contact information for follow-up providers    Deeann SaintMiller, Howard, MD. Schedule an appointment as soon as possible for a visit on 03/20/2020.   Specialty: Orthopedic Surgery Why: Please make appointment for this patient before discharge and 9am Contact information: [address] [place] [phone]            Contact information for after-discharge care    Destination    [facility] Preferred SNF .   Service: Skilled Nursing Contact information: [address] [place] [id number] [phone]                 Allergies  Allergen Reactions  . Metformin Diarrhea  . Metformin And  Related Diarrhea    Consultations:  Orthopedics   Procedures/Studies: DG Ankle 2 Views Right  Result Date: 03/08/2020 CLINICAL DATA:  Recent fall with ankle pain, initial encounter EXAM: RIGHT ANKLE - 2 VIEW COMPARISON:  None. FINDINGS: Oblique fracture through the distal fibula is noted. Fracture of the medial malleolus is seen. The talus is laterally dislocated with respect to the distal tibia. The medial malleolar and lateral malleolar fragments are displaced laterally as well with the talus. No other focal abnormality is noted. IMPRESSION: Distal tibial and fibular fractures with lateral dislocation of the talus with respect to the distal tibia. The distal fracture fragments are displaced laterally as well.  Electronically Signed   By: Alcide Clever M.D.   On: 03/08/2020 01:55   DG Ankle Complete Right  Result Date: 03/10/2020 CLINICAL DATA:  RIGHT ankle fracture on Thursday. Evaluate alignment. EXAM: RIGHT ANKLE - COMPLETE 3+ VIEW COMPARISON:  Plain film of the RIGHT ankle dated 03/08/2020. FINDINGS: Casting in place. Again noted are displaced fractures of the distal fibula and medial malleolus, both stable in alignment. Ankle mortise is stable in alignment. IMPRESSION: Stable alignment of the displaced fractures of the distal fibula and medial malleolus. Electronically Signed   By: Bary Richard M.D.   On: 03/10/2020 05:16   CT Ankle Right Wo Contrast  Result Date: 03/08/2020 CLINICAL DATA:  Trimalleolar fracture after a fall, preoperative assessment. EXAM: CT OF THE RIGHT ANKLE WITHOUT CONTRAST TECHNIQUE: Multidetector CT imaging of the right ankle was performed according to the standard protocol. Multiplanar CT image reconstructions were also generated. COMPARISON:  Radiograph 03/08/2020 FINDINGS: Bones/Joint/Cartilage Stage 4 Weber B fracture observed with oblique lateral malleolar component (with mild posterior comminution as shown on image 45/9), transverse medial malleolar component (with distraction up to 0.5 cm), and a small oblique posterior malleolar component as shown on image 44/10. The tibial attachment site of the anterior inferior tibiofibular ligament has been avulsed as shown on image 79/5. Small os peroneus noted. Plantar and Achilles calcaneal spurs are present. Ligaments Suboptimally assessed by CT. Muscles and Tendons Abnormal fusiform thickening of the distal Achilles tendon likely reflecting tendinopathy. Notably thickened medial band of the plantar fascia probably from plantar fasciitis. No flexor tendon entrapment is identified. Soft tissues Subcutaneous edema and likely subcutaneous hematoma overlying the medial malleolus. Subcutaneous edema overlying the lateral malleolus. Mild  subcutaneous edema along the heel. IMPRESSION: 1. Stage 4 Weber B fracture of the ankle. 2. The tibial attachment site of the anterior inferior tibiofibular ligament has been avulsed. 3. Abnormal fusiform thickening of the distal Achilles tendon likely reflecting tendinopathy. 4. Notably thickened medial band of the plantar fascia probably from plantar fasciitis. 5. Plantar and Achilles calcaneal spurs. Electronically Signed   By: Gaylyn Rong M.D.   On: 03/08/2020 05:03   DG Ankle Right Port  Result Date: 03/11/2020 CLINICAL DATA:  Immobilization for fractures EXAM: PORTABLE RIGHT ANKLE - 2 VIEW COMPARISON:  March 10, 2020 FINDINGS: Frontal, oblique, and lateral views were obtained. There is an obliquely oriented fracture of the distal fibular diaphysis with slight lateral displacement of the distal fracture fragment with respect proximal fragment. There is a displaced fracture of the medial malleolus with fracture fragments unchanged in alignment. A fracture along the distal aspect of the tibia posteriorly is unchanged in alignment. No new fractures are evident. Ankle mortise appears grossly intact. There is no appreciable joint space narrowing. There are posteroinferior calcaneal spurs. There is distal Achilles tendinosis. IMPRESSION: No appreciable change in  alignment of fractures of the medial malleolus, distal fibula, and posterior distal tibia. Displacement of fracture fragments in these areas is noted. No new fracture. Ankle mortise appears grossly intact on immobilization images. There are calcaneal spurs. Electronically Signed   By: Bretta Bang III M.D.   On: 03/11/2020 14:16   DG Ankle Right Port  Result Date: 03/08/2020 CLINICAL DATA:  Post reduction EXAM: PORTABLE RIGHT ANKLE - 2 VIEW COMPARISON:  Same-day radiograph FINDINGS: Overlying splinting obscures fine bone and soft-tissue detail. Trimalleolar fracture in a pattern compatible with a Weber B stage IV injury with a trans  syndesmotic oblique fracture of the distal fibula, displaced transversely oriented medial malleolar fracture, and a small avulsion fracture of the posterior malleolus. There is markedly improved alignment post reduction with more normal location of the talus albeit with some persistent lateral talar shift. Circumferential soft tissue swelling is noted. IMPRESSION: Trimalleolar fracture in a pattern compatible with a Weber B stage IV injury, with markedly improved alignment post reduction. Minimal residual fracture displacement and lateral talar shift. Electronically Signed   By: Kreg Shropshire M.D.   On: 03/08/2020 02:23       Subjective: Has no complaints.  No shortness of breath or chest pain.  Discharge Exam: Vitals:   03/11/20 2357 03/12/20 0733  BP: (!) 146/97 (!) 152/64  Pulse: (!) 59 61  Resp: 18 16  Temp: 98.1 F (36.7 C) 98.3 F (36.8 C)  SpO2: 93% 94%   Vitals:   03/11/20 0804 03/11/20 1619 03/11/20 2357 03/12/20 0733  BP: (!) 147/75 (!) 141/60 (!) 146/97 (!) 152/64  Pulse: 65 67 (!) 59 61  Resp: Temp: 97.9 F (36.6 C) 98.6 F (37 C) 98.1 F (36.7 C) 98.3 F (36.8 C)  TempSrc: Oral Oral Oral Oral  SpO2: 97% 97% 93% 94%  Weight:        General: Pt is alert, awake, not in acute distress Cardiovascular: RRR, S1/S2 +, no rubs, no gallops Respiratory: CTA bilaterally, no wheezing, no rhonchi Abdominal: Soft, NT, ND, bowel sounds + Extremities: Right leg cast, no edema on the left    The results of significant diagnostics from this hospitalization (including imaging, microbiology, ancillary and laboratory) are listed below for reference.     Microbiology: Recent Results (from the past 240 hour(s))  SARS Coronavirus 2 by RT PCR (hospital order, performed in [facility] hospital lab) Nasopharyngeal Nasopharyngeal Swab     Status: None   Collection Time: 03/10/20  4:36 AM   Specimen: Nasopharyngeal Swab  Result Value Ref Range Status   SARS Coronavirus  2 NEGATIVE NEGATIVE Final    Comment: (NOTE) SARS-CoV-2 target nucleic acids are NOT DETECTED.  The SARS-CoV-2 RNA is generally detectable in upper and lower respiratory specimens during the acute phase of infection. The lowest concentration of SARS-CoV-2 viral copies this assay can detect is 250 copies / mL. A negative result does not preclude SARS-CoV-2 infection and should not be used as the sole basis for treatment or other patient management decisions.  A negative result may occur with improper specimen collection / handling, submission of specimen other than nasopharyngeal swab, presence of viral mutation(s) within the areas targeted by this assay, and inadequate number of viral copies (<250 copies / mL). A negative result must be combined with clinical observations, patient history, and epidemiological information.  Fact Sheet for Patients:   BoilerBrush.com.cy  Fact Sheet for Healthcare Providers: https://pope.com/  This test is not yet approved  or  cleared by the [place] and has been authorized for detection and/or diagnosis of SARS-CoV-2 by FDA under an Emergency Use Authorization (EUA).  This EUA will remain in effect (meaning this test can be used) for the duration of the COVID-19 declaration under Section 564(b)(1) of the Act, 21 U.S.C. section 360bbb-3(b)(1), unless the authorization is terminated or revoked sooner.  Performed at [facility], [address], [place], [place] [address]   Surgical PCR screen     Status: None   Collection Time: 03/10/20 11:44 AM   Specimen: Nasal Mucosa; Nasal Swab  Result Value Ref Range Status   MRSA, PCR NEGATIVE NEGATIVE Final   Staphylococcus aureus NEGATIVE NEGATIVE Final    Comment: (NOTE) The Xpert SA Assay (FDA approved for NASAL specimens in patients 31 years of age and older), is one component of a comprehensive surveillance program. It is not intended  to diagnose infection nor to guide or monitor treatment. Performed at [facility], [address] [place], [place] [address]      Labs: BNP (last 3 results) No results for input(s): BNP in the last 8760 hours. Basic Metabolic Panel: Recent Labs  Lab 03/08/20 0152 03/08/20 0201 03/09/20 2118 03/11/20 0514  NA 136 138 137 141  K 4.3 4.2 4.5 4.2  CL 102 100 100 107  CO2 26  --  26 28  GLUCOSE 146* 144* 148* 113*  BUN 29* 31* 24* 29*  CREATININE 1.56* 1.70* 1.45* 1.49*  CALCIUM 9.2  --  9.1 8.6*   Liver Function Tests: No results for input(s): AST, ALT, ALKPHOS, BILITOT, PROT, ALBUMIN in the last 168 hours. No results for input(s): LIPASE, AMYLASE in the last 168 hours. No results for input(s): AMMONIA in the last 168 hours. CBC: Recent Labs  Lab 03/08/20 0152 03/08/20 0201 03/09/20 2118 03/11/20 0514  WBC 7.1  --  7.9 5.2  HGB 12.4 13.3 12.4 11.6*  HCT 40.0 39.0 37.9 35.1*  MCV 88.7  --  84.2 83.8  PLT 225  --  206 185   Cardiac Enzymes: No results for input(s): CKTOTAL, CKMB, CKMBINDEX, TROPONINI in the last 168 hours. BNP: Invalid input(s): POCBNP CBG: Recent Labs  Lab 03/11/20 1149 03/11/20 1620 03/11/20 1956 03/12/20 0734 03/12/20 1135  GLUCAP 93 107* 114* 121* 112*   D-Dimer No results for input(s): DDIMER in the last 72 hours. Hgb A1c Recent Labs    03/10/20 1050  HGBA1C 5.8*   Lipid Profile No results for input(s): CHOL, HDL, LDLCALC, TRIG, CHOLHDL, LDLDIRECT in the last 72 hours. Thyroid function studies No results for input(s): TSH, T4TOTAL, T3FREE, THYROIDAB in the last 72 hours.  Invalid input(s): FREET3 Anemia work up No results for input(s): VITAMINB12, FOLATE, FERRITIN, TIBC, IRON, RETICCTPCT in the last 72 hours. Urinalysis    Component Value Date/Time   COLORURINE YELLOW (A) 03/10/2020 0435   APPEARANCEUR HAZY (A) 03/10/2020 0435   LABSPEC 1.014 03/10/2020 0435   PHURINE 5.0 03/10/2020 0435   GLUCOSEU NEGATIVE  03/10/2020 0435   HGBUR NEGATIVE 03/10/2020 0435   BILIRUBINUR NEGATIVE 03/10/2020 0435   KETONESUR NEGATIVE 03/10/2020 0435   PROTEINUR 30 (A) 03/10/2020 0435   NITRITE NEGATIVE 03/10/2020 0435   LEUKOCYTESUR NEGATIVE 03/10/2020 0435   Sepsis Labs Invalid input(s): PROCALCITONIN,  WBC,  LACTICIDVEN Microbiology Recent Results (from the past 240 hour(s))  SARS Coronavirus 2 by RT PCR (hospital order, performed in [facility] hospital lab) Nasopharyngeal Nasopharyngeal Swab     Status: None  Collection Time: 03/10/20  4:36 AM   Specimen: Nasopharyngeal Swab  Result Value Ref Range Status   SARS Coronavirus 2 NEGATIVE NEGATIVE Final    Comment: (NOTE) SARS-CoV-2 target nucleic acids are NOT DETECTED.  The SARS-CoV-2 RNA is generally detectable in upper and lower respiratory specimens during the acute phase of infection. The lowest concentration of SARS-CoV-2 viral copies this assay can detect is 250 copies / mL. A negative result does not preclude SARS-CoV-2 infection and should not be used as the sole basis for treatment or other patient management decisions.  A negative result may occur with improper specimen collection / handling, submission of specimen other than nasopharyngeal swab, presence of viral mutation(s) within the areas targeted by this assay, and inadequate number of viral copies (<250 copies / mL). A negative result must be combined with clinical observations, patient history, and epidemiological information.  Fact Sheet for Patients:   BoilerBrush.com.cy  Fact Sheet for Healthcare Providers: https://pope.com/  This test is not yet approved or  cleared by the [place] FDA and has been authorized for detection and/or diagnosis of SARS-CoV-2 by FDA under an Emergency Use Authorization (EUA).  This EUA will remain in effect (meaning this test can be used) for the duration of the COVID-19 declaration under  Section 564(b)(1) of the Act, 21 U.S.C. section 360bbb-3(b)(1), unless the authorization is terminated or revoked sooner.  Performed at [facility], [address] [place], [place] [address]   Surgical PCR screen     Status: None   Collection Time: 03/10/20 11:44 AM   Specimen: Nasal Mucosa; Nasal Swab  Result Value Ref Range Status   MRSA, PCR NEGATIVE NEGATIVE Final   Staphylococcus aureus NEGATIVE NEGATIVE Final    Comment: (NOTE) The Xpert SA Assay (FDA approved for NASAL specimens in patients 42 years of age and older), is one component of a comprehensive surveillance program. It is not intended to diagnose infection nor to guide or monitor treatment. Performed at [facility], [address] [place], [place] [address]      Time coordinating discharge: Over 30 minutes  SIGNED:   Lynn Ito, MD  [facility] 03/12/2020, 12:25 PM Pager   If 7PM-7AM, please contact night-coverage [email or web address] Password [email or web address]

## 2020-03-12 NOTE — TOC Progression Note (Signed)
Transition of Care [facility]) - Progression Note    Patient Details  Name: Krystal Hoffman MRN: [id number] Date of Birth: 12-28-1950  Transition of Care [facility]) CM/SW [place], RN Phone Number: 03/12/2020, 10:48 AM  Clinical Narrative:     Met with the patient to discuss DC plan and needs, she is planning to go to rehab, I reviewed the bed choices with her and she chose [facility], I notified Chris at [facility], the patient has been fully covid vaccinated and will not need a Covid test to DC, I am awaiting PT eval notes to be able to get insurance approval to go to SNF, Once PT eval notes are in the system I will call [facility] to get insurance approval       Expected Discharge Plan and Services                                                 Social Determinants of Health (SDOH) Interventions    Readmission Risk Interventions No flowsheet data found.

## 2020-03-12 NOTE — TOC Transition Note (Signed)
Transition of Care [facility]) - CM/SW Discharge Note   Patient Details  Name: Krystal Hoffman MRN: [id number] Date of Birth: 11/30/1950  Transition of Care [facility]) CM/SW Contact:  Barrie Dunker, RN Phone Number: 03/12/2020, 1:43 PM   Clinical Narrative:     Patient to DC to [facility], Nurse has called report to [facility] CM called EMS they stated that she is the next one to be picked up, The patient to alert her family of the DC        Patient Goals and CMS Choice        Discharge Placement                       Discharge Plan and Services                                     Social Determinants of Health (SDOH) Interventions     Readmission Risk Interventions No flowsheet data found.

## 2020-03-12 NOTE — Evaluation (Signed)
Physical Therapy Evaluation Patient Details Name: Krystal Hoffman MRN: [id number] DOB: [birth year] Today's Date: 03/12/2020   History of Present Illness  Pt is a 69 yo female with R trimalleolar fx on 7/9 after a fall. PMH of depression, CKDII, DM, HLD, HTN.  Clinical Impression  Pt alert and oriented upon arrival to room.  Pt reports 5/10 pain in the R LE with some residual soreness reported in the thigh.  Pt was seated on bedside commode upon entering the room and was having a bowel movement.    Pt was able to perform STS from bedside commode, with minA and verbal cuing for hand placement.  Pt reminded of weight bearing precautions prior to standing and transfer.  Pt was very impulsive when performing the standing pivot, pt very hurried through motion and cued for activity pacing.  Pt was able to keep weight off of foot, but did so in an unpredictable manner and was educated on proper use of RW and hop to gait pattern.  While in supine, pt performed exercises well and was able to perform them while keeping her RLE elevated in order to keep precautions.  Pt will continue to benefit from skilled therapy to address the deficits listed below and for safety management needed during transfers. Due to inability to have caretaker at home as well as continued education on safety awareness, and level of assistance needed, recommend SNF at discharge.     Follow Up Recommendations SNF    Equipment Recommendations  Rolling walker with 5" wheels;3in1 (PT)    Recommendations for Other Services       Precautions / Restrictions Precautions Precautions: Fall Precaution Comments: bed rest c bathroom privileges  Required Braces or Orthoses: Splint/Cast Splint/Cast: R LE serial cast Splint/Cast - Date Prophylactic Dressing Applied (if applicable): 03/11/20 Restrictions Weight Bearing Restrictions: Yes RLE Weight Bearing: Non weight bearing      Mobility  Bed Mobility Overal bed mobility: Needs  Assistance Bed Mobility: Supine to Sit     Supine to sit: Supervision        Transfers Overall transfer level: Needs assistance Equipment used: Rolling walker (2 wheeled) Transfers: Sit to/from [employer] Sit to Stand: Min assist Stand pivot transfers: Min assist       General transfer comment: Able to maintain NWB precautions.  Ambulation/Gait                Stairs            Wheelchair Mobility    Modified Rankin (Stroke Patients Only)       Balance Overall balance assessment: Needs assistance Sitting-balance support: Single extremity supported;Feet supported (RLE dangling, LLE supported) Sitting balance-Leahy Scale: Good     Standing balance support: Bilateral upper extremity supported;During functional activity Standing balance-Leahy Scale: Fair                               Pertinent Vitals/Pain Pain Assessment: 0-10 Pain Score: 5  Pain Location: RLE Pain Descriptors / Indicators: Aching;Discomfort;Dull Pain Intervention(s): Limited activity within patient's tolerance;Premedicated before session;Repositioned    Home Living Family/patient expects to be discharged to:: Private residence Living Arrangements: Spouse/significant other Available Help at Discharge: Family;Available 24 hours/day (unable to provide physical assist ) Type of Home: House Home Access: Stairs to enter Entrance Stairs-Rails: Right;Left Entrance Stairs-Number of Steps: 6  (8 STE other entrance) Home Layout: One level Home Equipment: None  Prior Function Level of Independence: Independent         Comments: Pt cares for husband including IADLs     Hand Dominance   Dominant Hand: Right    Extremity/Trunk Assessment   Upper Extremity Assessment Upper Extremity Assessment: Overall WFL for tasks assessed    Lower Extremity Assessment Lower Extremity Assessment: RLE deficits/detail;Generalized weakness RLE Deficits /  Details: Decreased strength in the R LE. RLE: Unable to fully assess due to immobilization       Communication   Communication: No difficulties  Cognition Arousal/Alertness: Awake/alert Behavior During Therapy: WFL for tasks assessed/performed Overall Cognitive Status: Within Functional Limits for tasks assessed                                        General Comments General comments (skin integrity, edema, etc.): SpO2 93% on RA following BSC t/f - resolved c rest     Exercises Total Joint Exercises Ankle Circles/Pumps: AROM;Left;10 reps;Supine Hip ABduction/ADduction: AROM;Strengthening;Both;10 reps;Supine Straight Leg Raises: AROM;Strengthening;Both;10 reps;Supine Knee Flexion: AROM;Strengthening;Both;10 reps;Supine Other Exercises Other Exercises: Pt educated re: OT role, DME recs, falls prevention, ECS, d/c recs, home/routines modifications Other Exercises: LBD, toileting, sup<>sit, sit<>stand, SPT, sitting/standing balance/tolerance   Assessment/Plan    PT Assessment Patient needs continued PT services  PT Problem List Decreased strength;Decreased mobility;Decreased safety awareness;Decreased range of motion;Decreased knowledge of precautions;Decreased activity tolerance;Pain;Decreased knowledge of use of DME;Decreased balance       PT Treatment Interventions DME instruction;Gait training;Stair training;Functional mobility training;Therapeutic activities;Therapeutic exercise;Balance training;Neuromuscular re-education;Patient/family education;Wheelchair mobility training    PT Goals (Current goals can be found in the Care Plan section)  Acute Rehab PT Goals Patient Stated Goal: To go to SNF  PT Goal Formulation: With patient Time For Goal Achievement: 03/26/20 Potential to Achieve Goals: Good    Frequency 7X/week   Barriers to discharge Inaccessible home environment;Decreased caregiver support      Co-evaluation               AM-PAC PT "6  Clicks" Mobility  Outcome Measure Help needed turning from your back to your side while in a flat bed without using bedrails?: A Little Help needed moving from lying on your back to sitting on the side of a flat bed without using bedrails?: A Little Help needed moving to and from a bed to a chair (including a wheelchair)?: A Little Help needed standing up from a chair using your arms (e.g., wheelchair or bedside chair)?: A Little Help needed to walk in hospital room?: A Lot Help needed climbing 3-5 steps with a railing? : Total 6 Click Score: 15    End of Session Equipment Utilized During Treatment: Gait belt Activity Tolerance: Patient tolerated treatment well;Patient limited by pain Patient left: in bed;with call bell/phone within reach;with bed alarm set (RLE elevated)   PT Visit Diagnosis: Unsteadiness on feet (R26.81);Other abnormalities of gait and mobility (R26.89);Muscle weakness (generalized) (M62.81);History of falling (Z91.81);Difficulty in walking, not elsewhere classified (R26.2);Pain Pain - Right/Left: Right Pain - part of body: Ankle and joints of foot    Time: 0932-6712 PT Time Calculation (min) (ACUTE ONLY): 29 min   Charges:   PT Evaluation $PT Eval Low Complexity: 1 Low PT Treatments $Therapeutic Exercise: 8-22 mins       Nolon Bussing, PT, DPT 03/12/20, 11:56 AM

## 2020-03-12 NOTE — Progress Notes (Signed)
Subjective:    Patient is alert and awake.  She is more comfortable in the cast.  Neurovascular status good distally.  She is awaiting approval for skilled nursing at [facility].  Repeat x-rays following the cast application show excellent alignment of the fracture fragments in the joints.  She will remain nonweightbearing and return to see me in my office in about a week.  Enteric-coated aspirin 325 mg twice daily for DVT prophylaxis.  Patient reports pain as mild.  Objective: Patient is alert and in minimal pain.  Neurovascular status is good. VITALS:   Vitals:   03/11/20 2357 03/12/20 0733  BP: (!) 146/97 (!) 152/64  Pulse: (!) 59 61  Resp: 18 16  Temp: 98.1 F (36.7 C) 98.3 F (36.8 C)  SpO2: 93% 94%    Neurologically intact Neurovascular intact Sensation intact distally  LABS Recent Labs    03/09/20 2118 03/11/20 0514  HGB 12.4 11.6*  HCT 37.9 35.1*  WBC 7.9 5.2  PLT 206 185    Recent Labs    03/09/20 2118 03/11/20 0514  NA 137 141  K 4.5 4.2  BUN 24* 29*  CREATININE 1.45* 1.49*  GLUCOSE 148* 113*    Recent Labs    03/10/20 0536  INR 1.1     Assessment/Plan:      Up with therapy Discharge to SNF   Enteric-coated aspirin 325 mg twice daily.  Return to office in 1 week for exam and x-ray

## 2020-03-12 NOTE — TOC Progression Note (Signed)
Transition of Care [facility]) - Progression Note    Patient Details  Name: Krystal Hoffman MRN: [id number] Date of Birth: 18-Oct-1950  Transition of Care [facility]) CM/SW Contact  Barrie Dunker, RN Phone Number: 03/12/2020, 12:16 PM  Clinical Narrative:   Barrie Dunker [facility] to get rapid review for DC to [facility], faxed clinical to [facility] at [phone] Ref number [id number] Approved to go to [facility],  Coordinator Monique Start date 7/13 next review 7.15          Expected Discharge Plan and Services                                                 Social Determinants of Health (SDOH) Interventions    Readmission Risk Interventions No flowsheet data found.

## 2020-03-12 NOTE — Evaluation (Signed)
Occupational Therapy Evaluation Patient Details Name: Krystal Hoffman MRN: [id number] DOB: 03-30-1951 Today's Date: 03/12/2020    History of Present Illness Pt is a 69 yo female with R trimalleolar fx on 7/9 after a fall. PMH of depression, CKDII, DM, HLD, HTN.   Clinical Impression   Krystal Hoffman was seen for OT evaluation this date. Prior to hospital admission, pt was Independent c I/ADLs and functional mobility. Pt lives c husband in home c 6 STE, husband is unable to provide physical assist and pt's bathroom is not w/c accessible. Pt presents to acute OT demonstrating impaired ADL performance and functional mobility 2/2 decreased LB access, functional strength/ROM/balance deficits, and decreased activity tolerance. Pt currently requires MOD A + RW for BSC t/f. SUPERVISION for pericare c lateral leans. SUPERVISION don L sock at bed level - anticipate MOD A for LBD c rolling or lateral leans. Pt would benefit from skilled OT to address noted impairments and functional limitations (see below for any additional details) in order to maximize safety and independence while minimizing falls risk and caregiver burden. Upon hospital discharge, recommend STR to maximize pt safety and return to PLOF.     Follow Up Recommendations  SNF    Equipment Recommendations  3 in 1 bedside commode    Recommendations for Other Services       Precautions / Restrictions Precautions Precautions: Fall Precaution Comments: bed rest c bathroom privileges  Restrictions Weight Bearing Restrictions: Yes RLE Weight Bearing: Non weight bearing      Mobility Bed Mobility Overal bed mobility: Needs Assistance Bed Mobility: Supine to Sit     Supine to sit: Supervision        Transfers Overall transfer level: Needs assistance Equipment used: Rolling walker (2 wheeled) Transfers: Sit to/from [employer] Sit to Stand: Mod assist Stand pivot transfers: Min assist       General transfer comment:  Pt maintains NWBing pcns t/o t/fs     Balance Overall balance assessment: Needs assistance Sitting-balance support: Single extremity supported;Feet supported (RLE dangling, LLE supported) Sitting balance-Leahy Scale: Good     Standing balance support: Bilateral upper extremity supported;During functional activity Standing balance-Leahy Scale: Fair                             ADL either performed or assessed with clinical judgement   ADL Overall ADL's : Needs assistance/impaired                                       General ADL Comments: MOD A + RW for BSC t/f. SUPERVISION wiping c lateral leans. SUPERVISION don L sock at bed level - anticipate MOD A for LBD rolling at bed level. Anticipate SETUP + SUPERVISION for UBD in sitting     Vision         Perception     Praxis      Pertinent Vitals/Pain Pain Assessment: 0-10 Pain Score: 5  Pain Location: RLE Pain Descriptors / Indicators: Aching;Discomfort;Dull Pain Intervention(s): Limited activity within patient's tolerance;Premedicated before session;Repositioned     Hand Dominance     Extremity/Trunk Assessment Upper Extremity Assessment Upper Extremity Assessment: Overall WFL for tasks assessed   Lower Extremity Assessment Lower Extremity Assessment: RLE deficits/detail RLE: Unable to fully assess due to immobilization       Communication Communication Communication: No difficulties  Cognition Arousal/Alertness: Awake/alert Behavior During Therapy: WFL for tasks assessed/performed Overall Cognitive Status: Within Functional Limits for tasks assessed                                     General Comments  SpO2 93% on RA following BSC t/f - resolved c rest     Exercises Exercises: Other exercises Other Exercises Other Exercises: Pt educated re: OT role, DME recs, falls prevention, ECS, d/c recs, home/routines modifications Other Exercises: LBD, toileting, sup<>sit,  sit<>stand, SPT, sitting/standing balance/tolerance   Shoulder Instructions      Home Living Family/patient expects to be discharged to:: Private residence Living Arrangements: Spouse/significant other Available Help at Discharge: Family;Available 24 hours/day (unable to provide physical assist ) Type of Home: House Home Access: Stairs to enter [employer] of Steps: 6  (8 STE other entrance) Entrance Stairs-Rails: Right;Left Home Layout: One level     Bathroom Shower/Tub: Chief Strategy Officer: Standard Bathroom Accessibility: No (RW may fit, may only fit sideways )   Home Equipment: None          Prior Functioning/Environment Level of Independence: Independent        Comments: Pt cares for husband including IADLs        OT Problem List: Decreased strength;Decreased range of motion;Decreased activity tolerance;Impaired balance (sitting and/or standing);Decreased knowledge of use of DME or AE      OT Treatment/Interventions: Self-care/ADL training;Therapeutic exercise;Energy conservation;DME and/or AE instruction;Therapeutic activities;Patient/family education;Balance training    OT Goals(Current goals can be found in the care plan section) Acute Rehab OT Goals Patient Stated Goal: To go to SNF  OT Goal Formulation: With patient Time For Goal Achievement: 03/26/20 Potential to Achieve Goals: Good ADL Goals Pt Will Perform Grooming: with modified independence;sitting Pt Will Perform Lower Body Dressing: with caregiver independent in assisting;with supervision;bed level (c rolling at bed level) Pt Will Transfer to Toilet: with min guard assist;stand pivot transfer;bedside commode (c LRAD PRN) Pt Will Perform Toileting - Clothing Manipulation and hygiene: with modified independence;sitting/lateral leans  OT Frequency: Min 1X/week   Barriers to D/C: Inaccessible home environment;Decreased caregiver support          Co-evaluation               AM-PAC OT "6 Clicks" Daily Activity     Outcome Measure Help from another person eating meals?: None Help from another person taking care of personal grooming?: None Help from another person toileting, which includes using toliet, bedpan, or urinal?: A Lot Help from another person bathing (including washing, rinsing, drying)?: A Lot Help from another person to put on and taking off regular upper body clothing?: A Little Help from another person to put on and taking off regular lower body clothing?: A Lot 6 Click Score: 17   End of Session Equipment Utilized During Treatment: Gait belt;Rolling walker Nurse Communication: Mobility status (pt had BM )  Activity Tolerance: Patient tolerated treatment well Patient left: with nursing/sitter in room;Other (comment) (Pt left up on BSC c PT and RN in room )  OT Visit Diagnosis: Other abnormalities of gait and mobility (R26.89)                Time: 0034-9179 OT Time Calculation (min): 30 min Charges:  OT General Charges $OT Visit: 1 Visit OT Evaluation $OT Eval Moderate Complexity: 1 Mod OT Treatments $Self Care/Home Management : 8-22 mins $  Therapeutic Activity: 8-22 mins  Kathie Dike, M.S. OTR/L  03/12/20, 10:14 AM

## 2020-03-12 NOTE — Progress Notes (Addendum)
Contacted/reported to [employer], Selena Batten. Facility Nurse verbalized understanding via telephone.  Pt transported via EMS from [facility] to [facility].

## 2020-03-13 ENCOUNTER — Ambulatory Visit: Payer: Medicare Other | Admitting: Orthopaedic Surgery

## 2020-03-20 NOTE — ED Notes (Signed)
Need for emergent sedation and reduction. Verbal consent for sedation and reduction of R ankle

## 2020-04-25 ENCOUNTER — Telehealth: Payer: Self-pay

## 2020-04-25 NOTE — Telephone Encounter (Signed)
Pt called in and stated that she needs a refill on her clobetasol ointment. The pt stated that she saw DR. D last. I told her that he is no longer at this practice. I told the pt that we will need to schedule her for an appt . The pt told me she broke her ankle and that she will be out for the next 6 to 8 weeks. The pt wanted to make an appt but she is requesting a refill on the cream sent to [facility] in [place]. I told her that I will send a message to the nurse but it will be up to the provider if they will refill it or not. The pt verbally understood. Please advise

## 2020-04-26 NOTE — Telephone Encounter (Signed)
Please advise on refill.

## 2020-04-26 NOTE — Telephone Encounter (Signed)
Please excuse the pharmacy needs to be  [facility] - [place], [place] - [address]  [address] [place] [address]   The [facility]'t [facility] the [facility]. Please advise

## 2020-04-26 NOTE — Telephone Encounter (Signed)
LM for patient to return call.

## 2020-04-30 NOTE — Telephone Encounter (Signed)
Spoke with patient and gave her Dr. Logan Bores message. I offered to move patients appointment up sooner. I explained that she had not been seen in the clinic since 08/2017 and never seen him so he was unable to refill. Patient got very upset and started yelling that he would rather me be in pain than send me in a salve. I apologized to the patient and again offered to schedule patient an earlier appointment. Patient stated no and she would just get another doctor. Patient disconnected call.

## 2020-07-04 ENCOUNTER — Encounter: Payer: Medicare Other | Admitting: Obstetrics and Gynecology

## 2020-07-09 ENCOUNTER — Encounter: Payer: Medicare Other | Admitting: Obstetrics and Gynecology

## 2020-11-04 ENCOUNTER — Ambulatory Visit
Admission: RE | Admit: 2020-11-04 | Discharge: 2020-11-04 | Disposition: A | Discharge status: Home / Self Care | Payer: Medicare PPO | Source: Ambulatory Visit | Attending: Internal Medicine | Admitting: Internal Medicine

## 2020-11-04 ENCOUNTER — Other Ambulatory Visit: Payer: Self-pay

## 2020-11-04 DIAGNOSIS — Z1231 Encounter for screening mammogram for malignant neoplasm of breast: Secondary | ICD-10-CM | POA: Diagnosis not present

## 2021-04-14 ENCOUNTER — Other Ambulatory Visit: Payer: Self-pay

## 2021-04-14 ENCOUNTER — Emergency Department
Admission: EM | Admit: 2021-04-14 | Discharge: 2021-04-14 | Disposition: A | Discharge status: Home / Self Care | Payer: Medicare PPO | Attending: Emergency Medicine | Admitting: Emergency Medicine

## 2021-04-14 ENCOUNTER — Emergency Department: Payer: Medicare PPO

## 2021-04-14 DIAGNOSIS — Z7982 Long term (current) use of aspirin: Secondary | ICD-10-CM | POA: Insufficient documentation

## 2021-04-14 DIAGNOSIS — Z7984 Long term (current) use of oral hypoglycemic drugs: Secondary | ICD-10-CM | POA: Diagnosis not present

## 2021-04-14 DIAGNOSIS — S8991XA Unspecified injury of right lower leg, initial encounter: Secondary | ICD-10-CM | POA: Diagnosis present

## 2021-04-14 DIAGNOSIS — E1122 Type 2 diabetes mellitus with diabetic chronic kidney disease: Secondary | ICD-10-CM | POA: Diagnosis not present

## 2021-04-14 DIAGNOSIS — Z79899 Other long term (current) drug therapy: Secondary | ICD-10-CM | POA: Insufficient documentation

## 2021-04-14 DIAGNOSIS — W208XXA Other cause of strike by thrown, projected or falling object, initial encounter: Secondary | ICD-10-CM | POA: Diagnosis not present

## 2021-04-14 DIAGNOSIS — I129 Hypertensive chronic kidney disease with stage 1 through stage 4 chronic kidney disease, or unspecified chronic kidney disease: Secondary | ICD-10-CM | POA: Insufficient documentation

## 2021-04-14 DIAGNOSIS — N183 Chronic kidney disease, stage 3 unspecified: Secondary | ICD-10-CM | POA: Diagnosis not present

## 2021-04-14 DIAGNOSIS — M7989 Other specified soft tissue disorders: Secondary | ICD-10-CM

## 2021-04-14 DIAGNOSIS — S8011XA Contusion of right lower leg, initial encounter: Secondary | ICD-10-CM | POA: Diagnosis not present

## 2021-04-14 DIAGNOSIS — M79604 Pain in right leg: Secondary | ICD-10-CM | POA: Insufficient documentation

## 2021-04-14 DIAGNOSIS — R58 Hemorrhage, not elsewhere classified: Secondary | ICD-10-CM

## 2021-04-14 LAB — CK: Total CK: 90 U/L (ref 38–234)

## 2021-04-14 NOTE — Discharge Instructions (Addendum)
Please hold your Eliquis until your leg swelling and pain start to improve.  Please try to stay off your feet and keep the leg elevated to reduce swelling.  Please return to the emergency department immediately if you develop any numbness or weakness in your foot or leg.

## 2021-04-14 NOTE — ED Provider Notes (Signed)
[facility]  ____________________________________________   Event Date/Time   First MD Initiated Contact with Patient 04/14/21 1524     (approximate)  I have reviewed the triage vital signs and the nursing notes.   HISTORY  Chief Complaint Leg Injury    HPI Krystal Hoffman is a 70 y.o. female with past medical history of atrial fibrillation on Eliquis, diabetes, hypertension, hyperlipidemia who presents with right leg bruising and pain.  5 days ago patient was moving a table when the table accidentally fell on top of the right leg.  She has been able to ambulate since but does have increasing swelling of the right lower extremity and bruising.  She notes that there was 1 day when she was able to elevate the leg and not be on it for long and the swelling seemed to improve.  However she has been helping her daughter has been on her feet a lot and has really not been able to keep it elevated.  She feels like the swelling is worsening.  She denies any numbness or tingling in the foot or ankle or leg.  No other bleeding.  She just darted taking Eliquis 3 weeks ago.         Past Medical History:  Diagnosis Date   Anxiety    Diabetes mellitus without complication (HCC)    GERD (gastroesophageal reflux disease)    High cholesterol    Hypercholesteremia    Hypertension    Lichen sclerosus    Vitamin D deficiency     Patient Active Problem List   Diagnosis Date Noted   Closed right trimalleolar fracture 03/10/2020   Obesity, Class III, BMI 40-49.9 (morbid obesity) (HCC) 03/10/2020   Closed right ankle fracture 03/10/2020   Depression    Lichen sclerosus 06/06/2015   Class 3 obesity due to excess calories with body mass index (BMI) of 40.0 to 44.9 in adult 06/06/2015   Menopause 06/06/2015   Chronic kidney disease, stage II (mild) 10/31/2014   Type 2 diabetes mellitus with stage 3 chronic kidney disease (HCC) 05/02/2014   Acid reflux 05/02/2014   HLD  (hyperlipidemia) 05/02/2014   Hypertension 05/02/2014    Past Surgical History:  Procedure Laterality Date   SHOULDER SURGERY     tear   TUBAL LIGATION     1986    Prior to Admission medications   Medication Sig Start Date End Date Taking? Authorizing Provider  amLODipine (NORVASC) 10 MG tablet TAKE 1 TABLET DAILY (CALL FOR APPOINTMENT) 02/20/15   [provider]  aspirin 325 MG tablet Take 1 tablet (325 mg total) by mouth 2 (two) times daily. 03/12/20   Lynn Ito, MD  atorvastatin (LIPITOR) 10 MG tablet TAKE 1 TABLET DAILY 04/02/15   [provider]  FLUoxetine (PROZAC) 20 MG capsule TAKE 1 CAPSULE DAILY 02/27/15   [provider]  glimepiride (AMARYL) 4 MG tablet TAKE ONE-HALF (1/2) TABLET TWICE A DAY 11/08/14   [provider]  glucose blood (BAYER CONTOUR NEXT TEST) test strip USE TWICE A DAY 03/18/15   [provider]  hydrochlorothiazide (MICROZIDE) 12.5 MG capsule TAKE 1 CAPSULE DAILY 05/27/15   [provider]  HYDROcodone-acetaminophen (NORCO/VICODIN) 5-325 MG tablet Take 1 tablet by mouth every 4 (four) hours as needed for moderate pain. 03/08/20   Pollina, Canary Brim, MD  JANUVIA 100 MG tablet Take 100 mg by mouth daily. 03/03/20   [provider]  latanoprost (XALATAN) 0.005 % ophthalmic solution Place 1  drop into both eyes at bedtime. 12/24/19   [provider]  losartan (COZAAR) 100 MG tablet TAKE 1 TABLET DAILY (CALL FOR APPOINTMENT, NEED COMPLETE PHYSICAL EXAM) 01/07/15   [provider]  Multiple Vitamin (MULTIVITAMIN) capsule Take 1 capsule by mouth daily.    [provider]  omeprazole (PRILOSEC) 20 MG capsule TAKE 1 CAPSULE DAILY 02/22/15   [provider]  polyethylene glycol (MIRALAX / GLYCOLAX) 17 g packet Take 17 g by mouth daily as needed for mild constipation. 03/12/20   Lynn Ito, MD  Vitamin D, Ergocalciferol, (DRISDOL) 1.25 MG (50000 UNIT) CAPS capsule Take 1 capsule  (50,000 Units total) by mouth every 7 (seven) days. 03/15/20   Lynn Ito, MD    Allergies Metformin and Metformin and related  Family History  Problem Relation Age of Onset   Lung cancer Mother    Uterine cancer Mother    Brain cancer Father    Ovarian cancer Neg Hx    Drug abuse Neg Hx    Heart disease Neg Hx    Breast cancer Neg Hx    Colon cancer Neg Hx    Diabetes Neg Hx     Social History Social History   Tobacco Use   Smoking status: Never   Smokeless tobacco: Never  Vaping Use   Vaping Use: Never used  Substance Use Topics   Alcohol use: No    Comment: Socially    Drug use: No    Review of Systems   Review of Systems  Cardiovascular:  Positive for leg swelling.  Skin:  Positive for color change and wound.  Neurological:  Negative for weakness and numbness.  Hematological:  Bruises/bleeds easily.   Physical Exam Updated Vital Signs BP 96/62   Pulse (!) 55   Temp 98.1 F (36.7 C) (Oral)   Resp 18   Ht 5\' 7"  (1.702 m)   Wt 117.9 kg   SpO2 99%   BMI 40.72 kg/m   Physical Exam Vitals and nursing note reviewed.  Constitutional:      General: She is not in acute distress.    Appearance: Normal appearance.  HENT:     Head: Normocephalic and atraumatic.  Eyes:     General: No scleral icterus.    Conjunctiva/sclera: Conjunctivae normal.  Pulmonary:     Effort: Pulmonary effort is normal. No respiratory distress.     Breath sounds: No stridor.  Musculoskeletal:        General: No deformity or signs of injury.     Cervical back: Normal range of motion.     Comments: Right lower extremity is significantly more swollen compared to left Ecchymosis of the medial and lateral lower extremity extending to the ankle Compartment is soft Tender to palpation No pain with passive range of the foot 2+ DP pulses bilaterally Sensation intact over the medial and lateral foot and first dorsal webspace  Skin:    General: Skin is dry.     Coloration: Skin is  not jaundiced or pale.  Neurological:     General: No focal deficit present.     Mental Status: She is alert and oriented to person, place, and time. Mental status is at baseline.  Psychiatric:        Mood and Affect: Mood normal.        Behavior: Behavior normal.     LABS (all labs ordered are listed, but only abnormal results are displayed)  Labs Reviewed  CK   ____________________________________________  EKG  N/a ____________________________________________  RADIOLOGY Ky Barban, personally viewed and evaluated these images (plain radiographs) as part of my medical decision making, as well as reviewing the written report by the radiologist.  ED MD interpretation: I reviewed the x-ray of the right tib-fib vela/fibula which does not show any acute fracture    ____________________________________________   PROCEDURES  Procedure(s) performed (including Critical Care):  Procedures   ____________________________________________   INITIAL IMPRESSION / ASSESSMENT AND PLAN / ED COURSE     70 year old female on Eliquis who presents with right lower extremity pain and swelling after a table fell on it approximately 5 days ago.  On exam she does have significant swelling of the right compared to the left lower extremity with ecchymosis throughout.  She has paresthesias or weakness and she is neurovascular intact.  I have low suspicion for compartment syndrome as the compartment is soft and pain does not seem to be out of proportion.  We will check a CK however.  DVT study obtained in triage does not show any DVT and is consistent with hematoma of the right lower extremity.  We will also get an x-ray to rule out fracture.  Will discuss with patient's cardiologist holding her Eliquis.  No fracture noted.  CK is normal.  I discussed with Dr. Crissie Sickles holding the patient's Eliquis and he agreed.  Advised to hold Eliquis until leg is improving in size.  We discussed  return precautions for paresthesias or weakness, increasing size.  Advised that she elevate the leg and avoid being on her feet for now. Clinical Course as of 04/14/21 1812  Mon Apr 14, 2021  1639 IMPRESSION: 1.  No acute osseous injury of the right lower leg. 2. 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 3. Healed fracture of the distal fibular metaphysis.   [KM]  1652 CK Total: 90 [KM]    Clinical Course User Index [KM] Georga Hacking, MD     ____________________________________________   FINAL CLINICAL IMPRESSION(S) / ED DIAGNOSES  Final diagnoses:  Ecchymosis  Leg swelling     ED Discharge Orders     None        Note:  This document was prepared using Dragon voice recognition software and may include unintentional dictation errors.    Georga Hacking, MD 04/14/21 [phone]

## 2021-04-14 NOTE — ED Triage Notes (Signed)
Pt to ED for injury to left leg after hitting it on stool the other day. Reports increased swelling and bruising, recently started on blood thinners

## 2021-04-14 NOTE — ED Notes (Signed)
Pt states a stool fell on right leg yesterday, which initially did not hurt. Pt woke up today with right lower leg bruising, swelling, and pain. Pt states she is able to bear weight on affected leg. CMS is intact. Pt takes eliquis.

## 2021-04-22 ENCOUNTER — Inpatient Hospital Stay
Admission: EM | Admit: 2021-04-22 | Discharge: 2021-04-29 | DRG: 189 | Disposition: A | Discharge status: Home / Self Care | Payer: Medicare PPO | Attending: Internal Medicine | Admitting: Internal Medicine

## 2021-04-22 ENCOUNTER — Emergency Department: Payer: Medicare PPO

## 2021-04-22 ENCOUNTER — Other Ambulatory Visit: Payer: Self-pay

## 2021-04-22 ENCOUNTER — Inpatient Hospital Stay
Admit: 2021-04-22 | Discharge: 2021-04-22 | Disposition: A | Discharge status: Home / Self Care | Payer: Medicare PPO | Attending: Internal Medicine | Admitting: Internal Medicine

## 2021-04-22 DIAGNOSIS — N1832 Chronic kidney disease, stage 3b: Secondary | ICD-10-CM | POA: Diagnosis present

## 2021-04-22 DIAGNOSIS — E782 Mixed hyperlipidemia: Secondary | ICD-10-CM

## 2021-04-22 DIAGNOSIS — Z79899 Other long term (current) drug therapy: Secondary | ICD-10-CM

## 2021-04-22 DIAGNOSIS — E7800 Pure hypercholesterolemia, unspecified: Secondary | ICD-10-CM | POA: Diagnosis present

## 2021-04-22 DIAGNOSIS — E1169 Type 2 diabetes mellitus with other specified complication: Secondary | ICD-10-CM | POA: Diagnosis present

## 2021-04-22 DIAGNOSIS — E1122 Type 2 diabetes mellitus with diabetic chronic kidney disease: Secondary | ICD-10-CM | POA: Diagnosis present

## 2021-04-22 DIAGNOSIS — L900 Lichen sclerosus et atrophicus: Secondary | ICD-10-CM | POA: Diagnosis present

## 2021-04-22 DIAGNOSIS — Z7984 Long term (current) use of oral hypoglycemic drugs: Secondary | ICD-10-CM

## 2021-04-22 DIAGNOSIS — I129 Hypertensive chronic kidney disease with stage 1 through stage 4 chronic kidney disease, or unspecified chronic kidney disease: Secondary | ICD-10-CM | POA: Diagnosis present

## 2021-04-22 DIAGNOSIS — J9601 Acute respiratory failure with hypoxia: Secondary | ICD-10-CM | POA: Diagnosis present

## 2021-04-22 DIAGNOSIS — Z888 Allergy status to other drugs, medicaments and biological substances status: Secondary | ICD-10-CM

## 2021-04-22 DIAGNOSIS — F419 Anxiety disorder, unspecified: Secondary | ICD-10-CM | POA: Diagnosis present

## 2021-04-22 DIAGNOSIS — J9811 Atelectasis: Secondary | ICD-10-CM | POA: Diagnosis present

## 2021-04-22 DIAGNOSIS — I509 Heart failure, unspecified: Secondary | ICD-10-CM | POA: Diagnosis not present

## 2021-04-22 DIAGNOSIS — K219 Gastro-esophageal reflux disease without esophagitis: Secondary | ICD-10-CM | POA: Diagnosis present

## 2021-04-22 DIAGNOSIS — W2203XA Walked into furniture, initial encounter: Secondary | ICD-10-CM | POA: Diagnosis present

## 2021-04-22 DIAGNOSIS — J90 Pleural effusion, not elsewhere classified: Secondary | ICD-10-CM | POA: Diagnosis present

## 2021-04-22 DIAGNOSIS — R001 Bradycardia, unspecified: Secondary | ICD-10-CM | POA: Diagnosis present

## 2021-04-22 DIAGNOSIS — S8011XD Contusion of right lower leg, subsequent encounter: Secondary | ICD-10-CM | POA: Diagnosis not present

## 2021-04-22 DIAGNOSIS — E559 Vitamin D deficiency, unspecified: Secondary | ICD-10-CM | POA: Diagnosis present

## 2021-04-22 DIAGNOSIS — Z7901 Long term (current) use of anticoagulants: Secondary | ICD-10-CM

## 2021-04-22 DIAGNOSIS — I472 Ventricular tachycardia: Secondary | ICD-10-CM | POA: Diagnosis not present

## 2021-04-22 DIAGNOSIS — F32A Depression, unspecified: Secondary | ICD-10-CM | POA: Diagnosis present

## 2021-04-22 DIAGNOSIS — Z20822 Contact with and (suspected) exposure to covid-19: Secondary | ICD-10-CM | POA: Diagnosis present

## 2021-04-22 DIAGNOSIS — L03116 Cellulitis of left lower limb: Secondary | ICD-10-CM

## 2021-04-22 DIAGNOSIS — J810 Acute pulmonary edema: Secondary | ICD-10-CM | POA: Diagnosis not present

## 2021-04-22 DIAGNOSIS — S7011XA Contusion of right thigh, initial encounter: Secondary | ICD-10-CM | POA: Diagnosis present

## 2021-04-22 DIAGNOSIS — Z6841 Body Mass Index (BMI) 40.0 and over, adult: Secondary | ICD-10-CM | POA: Diagnosis not present

## 2021-04-22 DIAGNOSIS — R0602 Shortness of breath: Secondary | ICD-10-CM | POA: Diagnosis present

## 2021-04-22 DIAGNOSIS — L03115 Cellulitis of right lower limb: Secondary | ICD-10-CM

## 2021-04-22 DIAGNOSIS — J811 Chronic pulmonary edema: Secondary | ICD-10-CM | POA: Diagnosis present

## 2021-04-22 DIAGNOSIS — I10 Essential (primary) hypertension: Secondary | ICD-10-CM | POA: Diagnosis present

## 2021-04-22 DIAGNOSIS — I4819 Other persistent atrial fibrillation: Secondary | ICD-10-CM | POA: Diagnosis present

## 2021-04-22 DIAGNOSIS — R0902 Hypoxemia: Secondary | ICD-10-CM

## 2021-04-22 LAB — CBC WITH DIFFERENTIAL/PLATELET
Abs Immature Granulocytes: 0.17 10*3/uL — ABNORMAL HIGH (ref 0.00–0.07)
Basophils Absolute: 0.1 10*3/uL (ref 0.0–0.1)
Basophils Relative: 1 %
Eosinophils Absolute: 0 10*3/uL (ref 0.0–0.5)
Eosinophils Relative: 0 %
HCT: 41.2 % (ref 36.0–46.0)
Hemoglobin: 12.5 g/dL (ref 12.0–15.0)
Immature Granulocytes: 3 %
Lymphocytes Relative: 8 %
Lymphs Abs: 0.5 10*3/uL — ABNORMAL LOW (ref 0.7–4.0)
MCH: 26.5 pg (ref 26.0–34.0)
MCHC: 30.3 g/dL (ref 30.0–36.0)
MCV: 87.5 fL (ref 80.0–100.0)
Monocytes Absolute: 0.4 10*3/uL (ref 0.1–1.0)
Monocytes Relative: 7 %
Neutro Abs: 5.3 10*3/uL (ref 1.7–7.7)
Neutrophils Relative %: 81 %
Platelets: 315 10*3/uL (ref 150–400)
RBC: 4.71 MIL/uL (ref 3.87–5.11)
RDW: 16.7 % — ABNORMAL HIGH (ref 11.5–15.5)
WBC: 6.5 10*3/uL (ref 4.0–10.5)
nRBC: 0.8 % — ABNORMAL HIGH (ref 0.0–0.2)

## 2021-04-22 LAB — COMPREHENSIVE METABOLIC PANEL
ALT: 20 U/L (ref 0–44)
AST: 21 U/L (ref 15–41)
Albumin: 3.8 g/dL (ref 3.5–5.0)
Alkaline Phosphatase: 75 U/L (ref 38–126)
Anion gap: 8 (ref 5–15)
BUN: 39 mg/dL — ABNORMAL HIGH (ref 8–23)
CO2: 30 mmol/L (ref 22–32)
Calcium: 9.2 mg/dL (ref 8.9–10.3)
Chloride: 102 mmol/L (ref 98–111)
Creatinine, Ser: 1.65 mg/dL — ABNORMAL HIGH (ref 0.44–1.00)
GFR, Estimated: 33 mL/min — ABNORMAL LOW (ref >60)
Glucose, Bld: 129 mg/dL — ABNORMAL HIGH (ref 70–99)
Potassium: 4.4 mmol/L (ref 3.5–5.1)
Sodium: 140 mmol/L (ref 135–145)
Total Bilirubin: 1.4 mg/dL — ABNORMAL HIGH (ref 0.3–1.2)
Total Protein: 7.8 g/dL (ref 6.5–8.1)

## 2021-04-22 LAB — RESP PANEL BY RT-PCR (FLU A&B, COVID) ARPGX2
Influenza A by PCR: NEGATIVE
Influenza B by PCR: NEGATIVE
SARS Coronavirus 2 by RT PCR: NEGATIVE

## 2021-04-22 LAB — BRAIN NATRIURETIC PEPTIDE: B Natriuretic Peptide: 426.8 pg/mL — ABNORMAL HIGH (ref 0.0–100.0)

## 2021-04-22 LAB — CBG MONITORING, ED: Glucose-Capillary: 134 mg/dL — ABNORMAL HIGH (ref 70–99)

## 2021-04-22 LAB — PROCALCITONIN: Procalcitonin: <0.1 ng/mL

## 2021-04-22 LAB — TROPONIN I (HIGH SENSITIVITY)
Troponin I (High Sensitivity): 23 ng/L — ABNORMAL HIGH (ref <18)
Troponin I (High Sensitivity): 20 ng/L — ABNORMAL HIGH (ref <18)

## 2021-04-22 MED ORDER — FUROSEMIDE 10 MG/ML IJ SOLN
40.0000 mg | Freq: Two times a day (BID) | INTRAMUSCULAR | Status: DC
  Administered 2021-04-22 – 2021-04-23 (×3): 40 mg via INTRAVENOUS
  Filled 2021-04-22 (×3): qty 4

## 2021-04-22 MED ORDER — ACETAMINOPHEN 650 MG RE SUPP: 650.0000 mg | Freq: Four times a day (QID) | RECTAL | Status: DC | PRN

## 2021-04-22 MED ORDER — POLYETHYLENE GLYCOL 3350 17 G PO PACK: 17.0000 g | PACK | Freq: Every day | ORAL | Status: DC | PRN

## 2021-04-22 MED ORDER — PANTOPRAZOLE SODIUM 40 MG PO TBEC
40.0000 mg | DELAYED_RELEASE_TABLET | Freq: Every day | ORAL | Status: DC
  Administered 2021-04-23 – 2021-04-29 (×7): 40 mg via ORAL
  Filled 2021-04-22 (×7): qty 1

## 2021-04-22 MED ORDER — SODIUM CHLORIDE 0.9 % IV SOLN
1.0000 g | Freq: Once | INTRAVENOUS | Status: AC
  Administered 2021-04-22: 1 g via INTRAVENOUS
  Filled 2021-04-22: qty 10

## 2021-04-22 MED ORDER — LATANOPROST 0.005 % OP SOLN
1.0000 [drp] | Freq: Every day | OPHTHALMIC | Status: DC
  Administered 2021-04-22 – 2021-04-28 (×7): 1 [drp] via OPHTHALMIC
  Filled 2021-04-22: qty 2.5

## 2021-04-22 MED ORDER — ONDANSETRON HCL 4 MG/2ML IJ SOLN: 4.0000 mg | Freq: Four times a day (QID) | INTRAMUSCULAR | Status: DC | PRN

## 2021-04-22 MED ORDER — ATORVASTATIN CALCIUM 10 MG PO TABS
10.0000 mg | ORAL_TABLET | Freq: Every day | ORAL | Status: DC
  Administered 2021-04-23 – 2021-04-29 (×7): 10 mg via ORAL
  Filled 2021-04-22 (×7): qty 1

## 2021-04-22 MED ORDER — FUROSEMIDE 10 MG/ML IJ SOLN
40.0000 mg | Freq: Once | INTRAMUSCULAR | Status: AC
  Administered 2021-04-22: 40 mg via INTRAVENOUS
  Filled 2021-04-22: qty 4

## 2021-04-22 MED ORDER — ACETAMINOPHEN 325 MG PO TABS
650.0000 mg | ORAL_TABLET | Freq: Four times a day (QID) | ORAL | Status: DC | PRN
  Administered 2021-04-23 – 2021-04-27 (×7): 650 mg via ORAL
  Filled 2021-04-22 (×9): qty 2

## 2021-04-22 MED ORDER — INSULIN ASPART 100 UNIT/ML IJ SOLN
0.0000 [IU] | Freq: Three times a day (TID) | INTRAMUSCULAR | Status: DC
  Administered 2021-04-22 – 2021-04-23 (×3): 2 [IU] via SUBCUTANEOUS
  Administered 2021-04-24: 3 [IU] via SUBCUTANEOUS
  Administered 2021-04-24 – 2021-04-25 (×2): 2 [IU] via SUBCUTANEOUS
  Administered 2021-04-25: 3 [IU] via SUBCUTANEOUS
  Administered 2021-04-26: 2 [IU] via SUBCUTANEOUS
  Administered 2021-04-26 (×2): 3 [IU] via SUBCUTANEOUS
  Administered 2021-04-26: 2 [IU] via SUBCUTANEOUS
  Administered 2021-04-27: 3 [IU] via SUBCUTANEOUS
  Administered 2021-04-27: 5 [IU] via SUBCUTANEOUS
  Administered 2021-04-28: 2 [IU] via SUBCUTANEOUS
  Administered 2021-04-28 – 2021-04-29 (×4): 3 [IU] via SUBCUTANEOUS
  Administered 2021-04-29: 5 [IU] via SUBCUTANEOUS
  Filled 2021-04-22 (×21): qty 1

## 2021-04-22 MED ORDER — AMLODIPINE BESYLATE 10 MG PO TABS
10.0000 mg | ORAL_TABLET | Freq: Every day | ORAL | Status: DC
  Administered 2021-04-23 – 2021-04-24 (×2): 10 mg via ORAL
  Filled 2021-04-22: qty 1
  Filled 2021-04-22: qty 2

## 2021-04-22 MED ORDER — IOHEXOL 350 MG/ML SOLN
60.0000 mL | Freq: Once | INTRAVENOUS | Status: AC | PRN
  Administered 2021-04-22: 60 mL via INTRAVENOUS

## 2021-04-22 MED ORDER — AMIODARONE HCL 200 MG PO TABS
200.0000 mg | ORAL_TABLET | Freq: Every day | ORAL | Status: DC
  Administered 2021-04-23 – 2021-04-29 (×7): 200 mg via ORAL
  Filled 2021-04-22 (×7): qty 1

## 2021-04-22 MED ORDER — LOSARTAN POTASSIUM 50 MG PO TABS
100.0000 mg | ORAL_TABLET | Freq: Every day | ORAL | Status: DC
  Administered 2021-04-23 – 2021-04-24 (×2): 100 mg via ORAL
  Filled 2021-04-22 (×2): qty 2

## 2021-04-22 MED ORDER — FLUOXETINE HCL 20 MG PO CAPS
20.0000 mg | ORAL_CAPSULE | Freq: Every day | ORAL | Status: DC
  Administered 2021-04-23 – 2021-04-29 (×7): 20 mg via ORAL
  Filled 2021-04-22 (×7): qty 1

## 2021-04-22 MED ORDER — ONDANSETRON HCL 4 MG PO TABS: 4.0000 mg | ORAL_TABLET | Freq: Four times a day (QID) | ORAL | Status: DC | PRN

## 2021-04-22 NOTE — ED Notes (Signed)
Dr Larinda Buttery at bedside.  Pt states had hematoma on lower R leg after injury (around 04/07/21) and was taken off Eliquis.  Pt developed SOB about2d ago.  R lower leg is swollen and red. Pt states is painful to touch. Is warmer than LLE.  Pt has a fib. Currently 98% on 4L.

## 2021-04-22 NOTE — H&P (Addendum)
History and Physical    Krystal Hoffman AJG:[id number] DOB: 16-Jun-1951 DOA: 04/22/2021  PCP: Gracelyn Nurse, MD  Patient coming from: PCP office   Chief Complaint:  Chief Complaint  Patient presents with   Shortness of Breath     HPI:    70 year old female with multiple history of chronic kidney disease stage IIIb, hypertension, hyperlipidemia, depression, gastroesophageal reflux disease and recent diagnosis of atrial fibrillation who presents to [facility] emergency department from her primary care provider's office for hypoxia and shortness of breath.  Patient explains that in early July she started to develop mild shortness of breath.  After seeing her primary care provider he was identified to be in atrial fibrillation and was referred to cardiology for evaluation.  Patient was being placed on amiodarone and Eliquis after which she states that her shortness of breath had completely resolved.  Patient states that in early August she was attempting to move a branch when it had tipped over and landed on her right leg.  In the days that followed, patient developed severe redness swelling and pain of the right lower extremity.  Patient describes this pain as a tightness in quality, moderate to severe in severity, worse with weightbearing or movement of the affected extremity.  Patient eventually presented to [facility] emergency department on 8/15 for evaluation.  Patient was felt to be suffering from a large hematoma of the right leg secondary to the recent trauma in the setting of her use of Eliquis.  Case was discussed with her cardiologist Dr. Crissie Sickles who recommended temporarily holding the Eliquis.  Patient explains she did hold her Eliquis for several days resulting in substantial improvement in the swelling of her right lower extremity.  Imaging is not followed however, patient noticed that she was becoming progressively more short of breath.  Shortness of breath at this time was more severe than the  last, moderate to severe in intensity.  Shortness of breath is worse with exertion and improved with rest.  Patient denies any associated chest pain.  Patient reports occasional episodes of paroxysmal nocturnal dyspnea in the days that followed as well as a questionable history of orthopnea.   Patient was to follow-up with her primary care provider today 8/23 for evaluation however upon arrival patient was noted to be significantly short of breath and hypoxic with oxygen saturations still in the upper 80s after being placed on 5 L of oxygen via nasal cannula.  Her primary care provider was concerned about a pulmonary embolism considering her recent discontinuation of Eliquis and therefore sent the patient to [facility] emergency department for evaluation.  Upon evaluation in the emergency department, CT angiogram of the chest was performed and was found to be negative for pulmonary embolism but did reveal bilateral pleural effusion.  COVID-19 testing was found to be negative.  There was question of a left upper lobe infiltrate on CT angiogram of the chest that was concerning for pneumonia but procalcitonin was obtained and was found to be unremarkable.  Patient was felt to be suffering from acute congestive heart failure and was administered a dose of 40 mg intravenous Lasix.  The hospitalist group was then called to assess the patient for admission to the hospital.  Review of Systems:   Review of Systems  Respiratory:  Positive for shortness of breath.   Cardiovascular:  Positive for orthopnea, leg swelling and PND.  All other systems reviewed and are negative.  Past Medical History:  Diagnosis Date   Anxiety  Diabetes mellitus without complication (HCC)    GERD (gastroesophageal reflux disease)    High cholesterol    Hypercholesteremia    Hypertension    Lichen sclerosus    Vitamin D deficiency     Past Surgical History:  Procedure Laterality Date   SHOULDER SURGERY     tear   TUBAL  LIGATION     1986     reports that she has never smoked. She has never used smokeless tobacco. She reports that she does not drink alcohol and does not use drugs.  Allergies  Allergen Reactions   Metformin Diarrhea   Metformin And Related Diarrhea    Family History  Problem Relation Age of Onset   Lung cancer Mother    Uterine cancer Mother    Brain cancer Father    Ovarian cancer Neg Hx    Drug abuse Neg Hx    Heart disease Neg Hx    Breast cancer Neg Hx    Colon cancer Neg Hx    Diabetes Neg Hx      Prior to Admission medications   Medication Sig Start Date End Date Taking? Authorizing Provider  amiodarone (PACERONE) 200 MG tablet Take 200 mg by mouth daily.   Yes [provider]  amLODipine (NORVASC) 10 MG tablet Take 10 mg by mouth daily.   Yes [provider]  apixaban (ELIQUIS) 5 MG TABS tablet Take 5 mg by mouth 2 (two) times daily.   Yes [provider]  atorvastatin (LIPITOR) 10 MG tablet Take 10 mg by mouth daily.   Yes [provider]  FLUoxetine (PROZAC) 20 MG capsule Take 20 mg by mouth daily.   Yes [provider]  glimepiride (AMARYL) 4 MG tablet Take 2 mg by mouth 2 (two) times daily before a meal.   Yes [provider]  hydrochlorothiazide (MICROZIDE) 12.5 MG capsule Take 12.5 mg by mouth daily.   Yes [provider]  latanoprost (XALATAN) 0.005 % ophthalmic solution Place 1 drop into both eyes at bedtime. 12/24/19  Yes [provider]  losartan (COZAAR) 100 MG tablet Take 100 mg by mouth daily.   Yes [provider]  Multiple Vitamin (MULTIVITAMIN) capsule Take 1 capsule by mouth daily.   Yes [provider]  omeprazole (PRILOSEC) 20 MG capsule Take 20 mg by mouth daily.   Yes [provider]  sitaGLIPtin (JANUVIA) 100 MG tablet Take 100 mg by mouth daily.   Yes [provider]  Vitamin D, Ergocalciferol, (DRISDOL) 1.25 MG (50000 UNIT) CAPS capsule Take  1 capsule (50,000 Units total) by mouth every 7 (seven) days. Patient taking differently: Take 50,000 Units by mouth 2 (two) times a week. 03/15/20  Yes Lynn Ito, MD  aspirin 325 MG tablet Take 1 tablet (325 mg total) by mouth 2 (two) times daily. Patient not taking: No sig reported 03/12/20   Lynn Ito, MD    Physical Exam: Vitals:   04/22/21 1400 04/22/21 1525 04/22/21 1530 04/22/21 1600  BP: 129/71 (!) 133/108 (!) 121/94 113/66  Pulse: 97 71 82 82  Resp: 20 18 18    Temp:      TempSrc:      SpO2: 90% 100% 99% 100%  Weight:      Height:        Constitutional: Awake alert and oriented x3, no associated distress.   Skin: Extensive ecchymosis of the lateral lower extremity with significant hyperemia noted of the distal leg.  No associated induration.  Good skin turgor otherwise.   Eyes: Pupils are equally reactive to light.  No evidence of scleral icterus or conjunctival pallor.  ENMT: Moist mucous membranes noted.  Posterior pharynx clear of any exudate or lesions.   Neck: normal, supple, no masses, no thyromegaly.  Notably elevated jugular venous pulse at 45 degrees. Respiratory: Diminished breath sounds at the bases with bibasilar and mid field rales.  No evidence of associated wheezing. Normal respiratory effort. No accessory muscle use.  Cardiovascular: Regular rate and rhythm, no murmurs / rubs / gallops.  Significant edema of the right lower extremity.. 2+ pedal pulses. No carotid bruits.  Chest:   Nontender without crepitus or deformity.   Back:   Nontender without crepitus or deformity. Abdomen: Abdomen is protuberant but soft and nontender.  No evidence of intra-abdominal masses.  Positive bowel sounds noted in all quadrants.   Musculoskeletal: No joint deformity upper and lower extremities. Good ROM, no contractures. Normal muscle tone.  Neurologic: CN 2-12 grossly intact. Sensation intact.  Patient moving all 4 extremities spontaneously.  Patient is following all  commands.  Patient is responsive to verbal stimuli.   Psychiatric: Patient exhibits normal mood with appropriate affect.  Patient seems to possess insight as to their current situation.     Labs on Admission: I have personally reviewed following labs and imaging studies -   CBC: Recent Labs  Lab 04/22/21 1153  WBC 6.5  NEUTROABS 5.3  HGB 12.5  HCT 41.2  MCV 87.5  PLT 315   Basic Metabolic Panel: Recent Labs  Lab 04/22/21 1153  NA 140  K 4.4  CL 102  CO2 30  GLUCOSE 129*  BUN 39*  CREATININE 1.65*  CALCIUM 9.2   GFR: Estimated Creatinine Clearance: 41.8 mL/min (A) (by C-G formula based on SCr of 1.65 mg/dL (H)). Liver Function Tests: Recent Labs  Lab 04/22/21 1153  AST 21  ALT 20  ALKPHOS 75  BILITOT 1.4*  PROT 7.8  ALBUMIN 3.8   No results for input(s): LIPASE, AMYLASE in the last 168 hours. No results for input(s): AMMONIA in the last 168 hours. Coagulation Profile: No results for input(s): INR, PROTIME in the last 168 hours. Cardiac Enzymes: No results for input(s): CKTOTAL, CKMB, CKMBINDEX, TROPONINI in the last 168 hours. BNP (last 3 results) No results for input(s): PROBNP in the last 8760 hours. HbA1C: No results for input(s): HGBA1C in the last 72 hours. CBG: No results for input(s): GLUCAP in the last 168 hours. Lipid Profile: No results for input(s): CHOL, HDL, LDLCALC, TRIG, CHOLHDL, LDLDIRECT in the last 72 hours. Thyroid Function Tests: No results for input(s): TSH, T4TOTAL, FREET4, T3FREE, THYROIDAB in the last 72 hours. Anemia Panel: No results for input(s): VITAMINB12, FOLATE, FERRITIN, TIBC, IRON, RETICCTPCT in the last 72 hours. Urine analysis:    Component Value Date/Time   COLORURINE YELLOW (A) 03/10/2020 0435   APPEARANCEUR HAZY (A) 03/10/2020 0435   LABSPEC 1.014 03/10/2020 0435   PHURINE 5.0 03/10/2020 0435   GLUCOSEU NEGATIVE 03/10/2020 0435   HGBUR NEGATIVE 03/10/2020 0435   BILIRUBINUR NEGATIVE 03/10/2020 0435   KETONESUR  NEGATIVE 03/10/2020 0435   PROTEINUR 30 (A) 03/10/2020 0435   NITRITE NEGATIVE 03/10/2020 0435   LEUKOCYTESUR NEGATIVE 03/10/2020 0435    Radiological Exams on Admission - Personally Reviewed: DG Chest 2 View  Result Date: 04/22/2021 CLINICAL DATA:  Shortness of breath. EXAM: CHEST - 2 VIEW COMPARISON:  None. FINDINGS: Mild cardiomegaly is noted. No pneumothorax is noted. Minimal bibasilar subsegmental  atelectasis or edema is noted with small bilateral pleural effusions. Bony thorax is unremarkable. IMPRESSION: Minimal bibasilar subsegmental atelectasis or edema is noted with small bilateral pleural effusions. Aortic Atherosclerosis (ICD10-I70.0). Electronically Signed   By: Lupita Raider M.D.   On: 04/22/2021 12:32   CT Angio Chest PE W/Cm &/Or Wo Cm  Result Date: 04/22/2021 CLINICAL DATA:  Dyspnea. EXAM: CT ANGIOGRAPHY CHEST WITH CONTRAST TECHNIQUE: Multidetector CT imaging of the chest was performed using the standard protocol during bolus administration of intravenous contrast. Multiplanar CT image reconstructions and MIPs were obtained to evaluate the vascular anatomy. CONTRAST:  22mL OMNIPAQUE IOHEXOL 350 MG/ML SOLN COMPARISON:  Radiograph of same day. FINDINGS: Cardiovascular: Satisfactory opacification of the pulmonary arteries to the segmental level. No evidence of pulmonary embolism. Mild cardiomegaly is noted. Atherosclerosis of thoracic aorta is noted without aneurysm formation. Coronary artery calcifications are noted. No pericardial effusion. Mediastinum/Nodes: No enlarged mediastinal, hilar, or axillary lymph nodes. Thyroid gland, trachea, and esophagus demonstrate no significant findings. Lungs/Pleura: No pneumothorax is noted. Moderate size right pleural effusion is noted with adjacent subsegmental atelectasis of the right lower lobe. Small left pleural effusion is noted with adjacent subsegmental atelectasis of the left lower lobe. Focal left upper lobe opacity is noted peripherally  consistent with subsegmental atelectasis or possibly pneumonia. Upper Abdomen: No acute abnormality. Musculoskeletal: No chest wall abnormality. No acute or significant osseous findings. Review of the MIP images confirms the above findings. IMPRESSION: No definite evidence of pulmonary embolus. Coronary artery calcifications are noted. Bilateral pleural effusions are noted with adjacent subsegmental atelectasis, right greater than left. Small focal opacity is noted peripherally in the left upper lobe concerning for subsegmental atelectasis or possibly pneumonia. Aortic Atherosclerosis (ICD10-I70.0). Electronically Signed   By: Lupita Raider M.D.   On: 04/22/2021 14:14    EKG: Personally reviewed.  Rhythm is atrial fibrillation with heart rate of 77 bpm.  Right bundle branch block, no dynamic ST segment changes appreciated.  Assessment/Plan Principal Problem:   Acute on chronic congestive heart failure [facility])  Patient presenting with several day history of increasing dyspnea on exertion, paroxysmal nocturnal dyspnea and what sounds to be pillow orthopnea over the past several days. Is in the setting of a recent diagnosis of atrial fibrillation in early July.  Patient did see cardiologist but has yet to be worked up with an echocardiogram. Patient exhibiting evidence of acute cardiogenic pulmonary edema.  Evidence of bilateral pleural effusions on chest imaging and elevated jugular venous pulse on exam with elevated BNP of 426.8. Treating patient with 40 mg of intravenous Lasix twice daily for now Strict input and output monitoring Serial chemistries to monitor renal function and electrolytes Echocardiogram in the morning  Active Problems:  Persistent atrial fibrillation (HCC)   Temporarily holding Eliquis due to large right lower extremity hematoma that is still quite painful Currently rate controlled and therefore we will continue home regimen of amiodarone Obtaining echocardiogram Monitoring  patient on telemetry Attempting to achieve euvolemia with intravenous Lasix as noted above.  Right lower extremity hematoma  Extensive right lower extremity hematoma markedly palpable over the anterior right leg with additional ecchymoses along the lateral surface of the right knee with distal significant hyperemia Right lower extremity ultrasound performed on 8/15 was negative for DVT. ER provider was concerned for possibility of cellulitis however clinically I do not believe that this is the case.  This is particularly considering the fact the patient states that her leg is already improving despite not  being on antibiotics prior to arrival. Patient did restart her Eliquis in the past 24 hours without the direction of a provider.  A decided to continue to hold the Eliquis for now Continues to slowly clinically improved which could take several more weeks.    Type 2 diabetes mellitus with stage 3b chronic kidney disease, without long-term current use of insulin (HCC)  Patient been placed on Accu-Cheks before every meal and nightly with sliding scale insulin Holding home regimen of hypoglycemics Hemoglobin A1C ordered Diabetic Diet    GERD without esophagitis  Continuing home regimen of daily PPI therapy.    Essential hypertension  Continue losartan    Chronic kidney disease, stage 3b (HCC)  Strict intake and output monitoring Creatinine near baseline Minimizing nephrotoxic agents as much as possible Serial chemistries to monitor renal function and electrolytes    Mixed diabetic hyperlipidemia associated with type 2 diabetes mellitus (HCC)    Continuing home regimen of lipid lowering therapy.   Code Status:  Full code Family Communication: deferred   Status is: Inpatient  Remains inpatient appropriate because:Ongoing diagnostic testing needed not appropriate for outpatient work up, IV treatments appropriate due to intensity of illness or inability to take PO, and Inpatient  level of care appropriate due to severity of illness  Dispo: The patient is from: Home              Anticipated d/c is to: Home              Patient currently is not medically stable to d/c.   Difficult to place patient No        Marinda ElkGeorge J Taira Knabe MD [facility] Pager [phone]  If 7PM-7AM, please contact night-coverage [email or web address] Use universal [place] password for that web site. If you do not have the password, please call the hospital operator.  04/22/2021, 7:28 PM

## 2021-04-22 NOTE — ED Provider Notes (Signed)
[facility] Emergency Department Provider Note   ____________________________________________   Event Date/Time   First MD Initiated Contact with Patient 04/22/21 1130     (approximate)  I have reviewed the triage vital signs and the nursing notes.   HISTORY  Chief Complaint Shortness of Breath    HPI Krystal Hoffman is a 70 y.o. female with past medical history of hypertension, hyperlipidemia, diabetes, CKD, and atrial fibrillation on Eliquis who presents to the ED complaining of shortness of breath.  Patient reports that for the past couple of days she has been having increasing difficulty breathing, although states she has not noticed it as much because she has been spending lots of time in bed.  She reports hitting her right leg against a bench about 2 weeks ago, has been dealing with increasing pain, swelling, and bruising to the right lower leg since then.  She was seen in the ED 1 week ago for this problem, when ultrasound was negative for DVT but did show fluid collection thought to likely be hematoma.  Her Eliquis has since been held.  She denies any fevers, cough, or chest pain and states swelling in her leg has seem to be improving.  She went to a follow-up appointment with her PCP today, noted to be significantly short of breath with low O2 sats.  She was placed on 5 L nasal cannula and transferred to the ED for further evaluation.        Past Medical History:  Diagnosis Date   Anxiety    Diabetes mellitus without complication (HCC)    GERD (gastroesophageal reflux disease)    High cholesterol    Hypercholesteremia    Hypertension    Lichen sclerosus    Vitamin D deficiency     Patient Active Problem List   Diagnosis Date Noted   Closed right trimalleolar fracture 03/10/2020   Obesity, Class III, BMI 40-49.9 (morbid obesity) (HCC) 03/10/2020   Closed right ankle fracture 03/10/2020   Depression    Lichen sclerosus 06/06/2015   Class 3  obesity due to excess calories with body mass index (BMI) of 40.0 to 44.9 in adult 06/06/2015   Menopause 06/06/2015   Chronic kidney disease, stage II (mild) 10/31/2014   Type 2 diabetes mellitus with stage 3 chronic kidney disease (HCC) 05/02/2014   Acid reflux 05/02/2014   HLD (hyperlipidemia) 05/02/2014   Hypertension 05/02/2014    Past Surgical History:  Procedure Laterality Date   SHOULDER SURGERY     tear   TUBAL LIGATION     1986    Prior to Admission medications   Medication Sig Start Date End Date Taking? Authorizing Provider  amLODipine (NORVASC) 10 MG tablet TAKE 1 TABLET DAILY (CALL FOR APPOINTMENT) 02/20/15   [provider]  aspirin 325 MG tablet Take 1 tablet (325 mg total) by mouth 2 (two) times daily. 03/12/20   Lynn Ito, MD  atorvastatin (LIPITOR) 10 MG tablet TAKE 1 TABLET DAILY 04/02/15   [provider]  FLUoxetine (PROZAC) 20 MG capsule TAKE 1 CAPSULE DAILY 02/27/15   [provider]  glimepiride (AMARYL) 4 MG tablet TAKE ONE-HALF (1/2) TABLET TWICE A DAY 11/08/14   [provider]  glucose blood (BAYER CONTOUR NEXT TEST) test strip USE TWICE A DAY 03/18/15   [provider]  hydrochlorothiazide (MICROZIDE) 12.5 MG capsule TAKE 1 CAPSULE DAILY 05/27/15   [provider]  HYDROcodone-acetaminophen (NORCO/VICODIN) 5-325 MG tablet Take 1 tablet by mouth every  4 (four) hours as needed for moderate pain. 03/08/20   Pollina, Canary Brimhristopher J, MD  JANUVIA 100 MG tablet Take 100 mg by mouth daily. 03/03/20   [provider]  latanoprost (XALATAN) 0.005 % ophthalmic solution Place 1 drop into both eyes at bedtime. 12/24/19   [provider]  losartan (COZAAR) 100 MG tablet TAKE 1 TABLET DAILY (CALL FOR APPOINTMENT, NEED COMPLETE PHYSICAL EXAM) 01/07/15   [provider]  Multiple Vitamin (MULTIVITAMIN) capsule Take 1 capsule by mouth daily.    [provider]  omeprazole (PRILOSEC) 20 MG capsule  TAKE 1 CAPSULE DAILY 02/22/15   [provider]  polyethylene glycol (MIRALAX / GLYCOLAX) 17 g packet Take 17 g by mouth daily as needed for mild constipation. 03/12/20   Lynn ItoAmery, Sahar, MD  Vitamin D, Ergocalciferol, (DRISDOL) 1.25 MG (50000 UNIT) CAPS capsule Take 1 capsule (50,000 Units total) by mouth every 7 (seven) days. 03/15/20   Lynn ItoAmery, Sahar, MD    Allergies Metformin and Metformin and related  Family History  Problem Relation Age of Onset   Lung cancer Mother    Uterine cancer Mother    Brain cancer Father    Ovarian cancer Neg Hx    Drug abuse Neg Hx    Heart disease Neg Hx    Breast cancer Neg Hx    Colon cancer Neg Hx    Diabetes Neg Hx     Social History Social History   Tobacco Use   Smoking status: Never   Smokeless tobacco: Never  Vaping Use   Vaping Use: Never used  Substance Use Topics   Alcohol use: No    Comment: Socially    Drug use: No    Review of Systems  Constitutional: No fever/chills Eyes: No visual changes. ENT: No sore throat. Cardiovascular: Denies chest pain. Respiratory: Positive for shortness of breath. Gastrointestinal: No abdominal pain.  No nausea, no vomiting.  No diarrhea.  No constipation. Genitourinary: Negative for dysuria. Musculoskeletal: Negative for back pain.  Positive for right leg pain and swelling. Skin: Negative for rash. Neurological: Negative for headaches, focal weakness or numbness.  ____________________________________________   PHYSICAL EXAM:  VITAL SIGNS: ED Triage Vitals  Enc Vitals Group     BP 04/22/21 1131 111/82     Pulse Rate 04/22/21 1131 (!) 50     Resp 04/22/21 1131 (!) 26     Temp 04/22/21 1139 97.8 F (36.6 C)     Temp Source 04/22/21 1131 Oral     SpO2 04/22/21 1131 98 %     Weight 04/22/21 1132 250 lb (113.4 kg)     Height 04/22/21 1132 5\' 7"  (1.702 m)     Head Circumference --      Peak Flow --      Pain Score 04/22/21 1132 8     Pain Loc --      Pain Edu? --      Excl. in  GC? --     Constitutional: Alert and oriented. Eyes: Conjunctivae are normal. Head: Atraumatic. Nose: No congestion/rhinnorhea. Mouth/Throat: Mucous membranes are moist. Neck: Normal ROM Cardiovascular: Normal rate, irregularly irregular rhythm. Grossly normal heart sounds.  2+ radial and DP pulses bilaterally. Respiratory: Normal respiratory effort.  No retractions. Lungs CTAB. Gastrointestinal: Soft and nontender. No distention. Genitourinary: deferred Musculoskeletal: Edema noted to right lower extremity with ecchymosis to right lateral thigh and medial calf.  Associated erythema, warmth, and tenderness to palpation noted to distal calf and shin. Neurologic:  Normal  speech and language. No gross focal neurologic deficits are appreciated. Skin:  Skin is warm, dry and intact. No rash noted. Psychiatric: Mood and affect are normal. Speech and behavior are normal.  ____________________________________________   LABS (all labs ordered are listed, but only abnormal results are displayed)  Labs Reviewed  CBC WITH DIFFERENTIAL/PLATELET - Abnormal; Notable for the following components:      Result Value   RDW 16.7 (*)    nRBC 0.8 (*)    Lymphs Abs 0.5 (*)    Abs Immature Granulocytes 0.17 (*)    All other components within normal limits  COMPREHENSIVE METABOLIC PANEL - Abnormal; Notable for the following components:   Glucose, Bld 129 (*)    BUN 39 (*)    Creatinine, Ser 1.65 (*)    Total Bilirubin 1.4 (*)    GFR, Estimated 33 (*)    All other components within normal limits  BRAIN NATRIURETIC PEPTIDE - Abnormal; Notable for the following components:   B Natriuretic Peptide 426.8 (*)    All other components within normal limits  TROPONIN I (HIGH SENSITIVITY) - Abnormal; Notable for the following components:   Troponin I (High Sensitivity) 23 (*)    All other components within normal limits  TROPONIN I (HIGH SENSITIVITY) - Abnormal; Notable for the following components:    Troponin I (High Sensitivity) 20 (*)    All other components within normal limits  RESP PANEL BY RT-PCR (FLU A&B, COVID) [id number]  PROCALCITONIN   ____________________________________________  EKG  ED ECG REPORT I, Chesley Noon, the attending physician, personally viewed and interpreted this ECG.   Date: 04/22/2021  EKG Time: 11:38  Rate: 77  Rhythm: atrial fibrillation  Axis: LAD  Intervals:right bundle branch block and left anterior fascicular block  ST&T Change: None   PROCEDURES  Procedure(s) performed (including Critical Care):  .Critical Care  Date/Time: 04/22/2021 2:47 PM Performed by: Chesley Noon, MD Authorized by: Chesley Noon, MD   Critical care provider statement:    Critical care time (minutes):  45   Critical care time was exclusive of:  Separately billable procedures and treating other patients and teaching time   Critical care was necessary to treat or prevent imminent or life-threatening deterioration of the following conditions:  Respiratory failure   Critical care was time spent personally by me on the following activities:  Discussions with consultants, evaluation of patient's response to treatment, examination of patient, ordering and performing treatments and interventions, ordering and review of laboratory studies, ordering and review of radiographic studies, pulse oximetry, re-evaluation of patient's condition, obtaining history from patient or surrogate and review of old charts   I assumed direction of critical care for this patient from another provider in my specialty: no     Care discussed with: admitting provider     ____________________________________________   INITIAL IMPRESSION / ASSESSMENT AND PLAN / ED COURSE      70 year old female with past medical history of hypertension, hyperlipidemia, diabetes, CKD, and atrial fibrillation on Eliquis who presents to the ED with increasing difficulty breathing over the past couple of days  along with ongoing pain and swelling to her right lower extremity.  Ultrasound during ED visit 1 week ago was negative for DVT and fluid collection seem most consistent with hematoma at that time.  Patient initially noted to be hypoxic at PCP, however O2 sat seem to have improved here in the ED, she is maintaining at 92 to 94% on room air.  We will further assess  for PE with CTA chest, EKG shows no evidence of arrhythmia or ischemia and I have low suspicion for ACS.  Chest x-ray reviewed by me and does show edema to bilateral lower lobes, CHF is also on differential and BNP is pending.  CTA is negative for PE but does show bilateral pleural effusions with apparent pulmonary edema, consistent with mildly elevated BNP.  Patient noted to drop O2 sats to the low 80s on room air, now improved on 2 L nasal cannula.  We will diurese with IV Lasix, I am also concerned that she is developing a cellulitis to her right lower extremity which we will treat with Rocephin given lack of purulence.  Plan to discuss with hospitalist for admission.      ____________________________________________   FINAL CLINICAL IMPRESSION(S) / ED DIAGNOSES  Final diagnoses:  Acute respiratory failure with hypoxia (HCC)  Acute congestive heart failure, unspecified heart failure type (HCC)  Cellulitis of leg, right     ED Discharge Orders     None        Note:  This document was prepared using Dragon voice recognition software and may include unintentional dictation errors.    Chesley Noon, MD 04/22/21 [phone]

## 2021-04-22 NOTE — ED Triage Notes (Signed)
Pt sent from Dr Blanche East office with c/o SOB over the past few days, pt was seen here 8/15 for hematoma of the RLE and taken off her eliquis, states she went for a follow up today and c/o increased SOB, PCP reports pt 88% on 5L [place] . Concerned about a PE. Upon arrival to the room pt is maintaining sats 96-99% on RA

## 2021-04-22 NOTE — ED Notes (Signed)
Pt to CT.  Pt desaturated to 80% on RA. Placed on 6L, then 4L, currently on 2L. Last SPO2 was 98% on 2L oxygen.

## 2021-04-22 NOTE — ED Notes (Signed)
Pt lying in bed asleep after being moved to holding area in C Pod. Pt in no obvious distress at this time. Pt vitals stable.

## 2021-04-23 DIAGNOSIS — J810 Acute pulmonary edema: Secondary | ICD-10-CM

## 2021-04-23 DIAGNOSIS — I4819 Other persistent atrial fibrillation: Secondary | ICD-10-CM | POA: Diagnosis not present

## 2021-04-23 DIAGNOSIS — S8011XD Contusion of right lower leg, subsequent encounter: Secondary | ICD-10-CM

## 2021-04-23 LAB — COMPREHENSIVE METABOLIC PANEL WITH GFR
ALT: 16 U/L (ref 0–44)
AST: 18 U/L (ref 15–41)
Albumin: 3.4 g/dL — ABNORMAL LOW (ref 3.5–5.0)
Alkaline Phosphatase: 64 U/L (ref 38–126)
Anion gap: 10 (ref 5–15)
BUN: 37 mg/dL — ABNORMAL HIGH (ref 8–23)
CO2: 34 mmol/L — ABNORMAL HIGH (ref 22–32)
Calcium: 9 mg/dL (ref 8.9–10.3)
Chloride: 101 mmol/L (ref 98–111)
Creatinine, Ser: 1.69 mg/dL — ABNORMAL HIGH (ref 0.44–1.00)
GFR, Estimated: 32 mL/min — ABNORMAL LOW (ref >60)
Glucose, Bld: 84 mg/dL (ref 70–99)
Potassium: 4.2 mmol/L (ref 3.5–5.1)
Sodium: 145 mmol/L (ref 135–145)
Total Bilirubin: 1.3 mg/dL — ABNORMAL HIGH (ref 0.3–1.2)
Total Protein: 6.9 g/dL (ref 6.5–8.1)

## 2021-04-23 LAB — CBC WITH DIFFERENTIAL/PLATELET
Abs Immature Granulocytes: 0.22 10*3/uL — ABNORMAL HIGH (ref 0.00–0.07)
Basophils Absolute: 0 10*3/uL (ref 0.0–0.1)
Basophils Relative: 1 %
Eosinophils Absolute: 0.1 10*3/uL (ref 0.0–0.5)
Eosinophils Relative: 2 %
HCT: 38.1 % (ref 36.0–46.0)
Hemoglobin: 11.3 g/dL — ABNORMAL LOW (ref 12.0–15.0)
Immature Granulocytes: 4 %
Lymphocytes Relative: 13 %
Lymphs Abs: 0.7 10*3/uL (ref 0.7–4.0)
MCH: 25.9 pg — ABNORMAL LOW (ref 26.0–34.0)
MCHC: 29.7 g/dL — ABNORMAL LOW (ref 30.0–36.0)
MCV: 87.4 fL (ref 80.0–100.0)
Monocytes Absolute: 0.6 10*3/uL (ref 0.1–1.0)
Monocytes Relative: 10 %
Neutro Abs: 4 10*3/uL (ref 1.7–7.7)
Neutrophils Relative %: 70 %
Platelets: 281 10*3/uL (ref 150–400)
RBC: 4.36 MIL/uL (ref 3.87–5.11)
RDW: 16.8 % — ABNORMAL HIGH (ref 11.5–15.5)
WBC: 5.7 10*3/uL (ref 4.0–10.5)
nRBC: 0.4 % — ABNORMAL HIGH (ref 0.0–0.2)

## 2021-04-23 LAB — ECHOCARDIOGRAM COMPLETE
AR max vel: 1.88 cm2
AV Area VTI: 1.92 cm2
AV Area mean vel: 1.74 cm2
AV Mean grad: 9.4 mmHg
AV Peak grad: 14.9 mmHg
Ao pk vel: 1.93 m/s
Height: 67 in
S' Lateral: 3.33 cm
Weight: 4000 [oz_av]

## 2021-04-23 LAB — CBG MONITORING, ED
Glucose-Capillary: 81 mg/dL (ref 70–99)
Glucose-Capillary: 137 mg/dL — ABNORMAL HIGH (ref 70–99)

## 2021-04-23 LAB — MAGNESIUM: Magnesium: 2.2 mg/dL (ref 1.7–2.4)

## 2021-04-23 LAB — HEMOGLOBIN A1C
Hgb A1c MFr Bld: 6.2 % — ABNORMAL HIGH (ref 4.8–5.6)
Mean Plasma Glucose: 131.24 mg/dL

## 2021-04-23 LAB — GLUCOSE, CAPILLARY
Glucose-Capillary: 124 mg/dL — ABNORMAL HIGH (ref 70–99)
Glucose-Capillary: 119 mg/dL — ABNORMAL HIGH (ref 70–99)

## 2021-04-23 LAB — HIV ANTIBODY (ROUTINE TESTING W REFLEX): HIV Screen 4th Generation wRfx: NONREACTIVE

## 2021-04-23 NOTE — ED Notes (Signed)
Patient was given breakfast tray with drink.

## 2021-04-23 NOTE — Consult Note (Addendum)
   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  Mild mitral regurgitation.  Mild tricuspid regurgitation.  Normal right ventricular systolic function.  She presented to the emergency room with shortness of breath worsening with exertion.  Comorbidities:  Chronic kidney disease stage III Hypertension Hyperlipidemia GERD Atrial fibrillation Anxiety  Medications:  Amiodarone 200 mg daily Amlodipine 10 mill atorvastatin 10 mg furosemide 40 mg IV 2 times Losartan 100 mg daily  Labs:  Sodium 145, potassium 4.2, chloride 101, CO2 34, BUN 37, creatinine 1.69, magnesium 2.2 Is 113.4 kg Blood pressure 92/55  Assessment:  General-she is awake and alert lying on the gurney in the emergency room,  HEENT-pupils are equal,  Cardiac-heart tones of irregular rate and rhythm.  Chest-breath sounds are clear to  Abdomen-soft rounded non tender  Lower extremities-no pitting edema noted, right lower leg with ecchymosis.  Psych-is pleasant and appropriate, makes good eye contact  Neurologic-she is clear, moves all extremities without difficulty.   Initial meeting with patient today, she states that her husband first noted her shortness of breath proximately 2 months ago and was diagnosed with atrial fibrillation.   Discussed low-sodium diet and the reasoning behind.  Also recommended removing the saltshaker from the table.  States that they have like out to eat a couple times a week but for the most part eating at home eating vegetables, meats like chicken, pork and steak during the summer fixed on the grill.  Importance of daily weights and recording.  Also discussed being compliant with her medications.  Reasoning behind 2000 cc fluid restriction.  She was given a living with heart failure teaching booklet along with information on low-sodium and zone magnet.  Discussed follow-up in the outpatient heart failure clinic with [facility]. She had not further  questions.  Will continue to follow.  Tresa Endo RN [facility]

## 2021-04-23 NOTE — ED Notes (Signed)
Jennifer RN aware of assigned bed 

## 2021-04-23 NOTE — Progress Notes (Signed)
PROGRESS NOTE    Krystal Hoffman  FOY:[id number] DOB: 1951/02/16 DOA: 04/22/2021 PCP: Gracelyn Nurse, MD    Assessment & Plan:   Principal Problem:   Acute on chronic congestive heart failure (HCC) Active Problems:   Type 2 diabetes mellitus with stage 3b chronic kidney disease, without long-term current use of insulin (HCC)   GERD without esophagitis   Essential hypertension   Chronic kidney disease, stage 3b (HCC)   Mixed diabetic hyperlipidemia associated with type 2 diabetes mellitus (HCC)   Persistent atrial fibrillation (HCC)  Pulmonary edema: etiology unclear. Echo shows EF 50-55%, normal diastolic function. No hx of CHF but has nocturnal dyspnea, orthopnea & mild elevation of BNP. Continue on IV lasix. Monitor I/Os.   Chronic atrial fibrillation: temporarily holding Eliquis due to large right lower extremity hematoma that is still quite painful. Consider starting xarelto instead when stable to restart anticoagulation. Continue on home dose of amiodarone. Cardio consulted, Dr. Juliann Pares   Right lower extremity hematoma: secondary to bench falling on RLE. [place] of RLE was neg for DVT but positive for likely hematoma on 8/15. Improved as per pt    DM2: well controlled w/ HbA1c 6.2. Continue on SSI w/ accuchecks   CKDIIIb: Cr is trending up from day prior. Avoid nephrotoxic meds   GERD : continue on PPI    HTN: continue on losartan     HLD: continue on statin   DVT prophylaxis: SCDs of LLE only  Code Status: full  Family Communication:  Disposition Plan: likely d/c back home.  Level of care: Progressive Cardiac  Status is: Inpatient  Remains inpatient appropriate because:IV treatments appropriate due to intensity of illness or inability to take PO and Inpatient level of care appropriate due to severity of illness  Dispo: The patient is from: Home              Anticipated d/c is to: Home              Patient currently is not medically stable to d/c.   Difficult to place  patient : unclear       Consultants:  cardio  Procedures:   Antimicrobials:   Subjective: Pt c/o shortness of breath, improved from day prior.   Objective: Vitals:   04/23/21 0230 04/23/21 0300 04/23/21 0600 04/23/21 0601  BP: 120/76 (!) 105/92 126/69   Pulse: 84 78 (!) 103 82  Resp: (!) 22 19 20 19   Temp:      TempSrc:      SpO2: 96% 95% (!) 88% 92%  Weight:      Height:        Intake/Output Summary (Last 24 hours) at 04/23/2021 0916 Last data filed at 04/23/2021 0221 Gross per 24 hour  Intake 240 ml  Output 2200 ml  Net -1960 ml   Filed Weights   04/22/21 1132  Weight: 113.4 kg    Examination:  General exam: Appears calm and comfortable. Morbidly obese  Respiratory system: diminished breath sounds b/l  Cardiovascular system: irregularly irregular. No rubs, gallops or clicks.  Gastrointestinal system: Abdomen is obese, soft and nontender. Normal bowel sounds heard. Central nervous system: Alert and oriented. Moves all extremities Psychiatry: Judgement and insight appear normal. Mood & affect appropriate.     Data Reviewed: I have personally reviewed following labs and imaging studies  CBC: Recent Labs  Lab 04/22/21 1153 04/23/21 0630  WBC 6.5 5.7  NEUTROABS 5.3 4.0  HGB 12.5 11.3*  HCT 41.2 38.1  MCV 87.5 87.4  PLT 315 281   Basic Metabolic Panel: Recent Labs  Lab 04/22/21 1153 04/23/21 0630  NA 140 145  K 4.4 4.2  CL 102 101  CO2 30 34*  GLUCOSE 129* 84  BUN 39* 37*  CREATININE 1.65* 1.69*  CALCIUM 9.2 9.0  MG  --  2.2   GFR: Estimated Creatinine Clearance: 40.8 mL/min (A) (by C-G formula based on SCr of 1.69 mg/dL (H)). Liver Function Tests: Recent Labs  Lab 04/22/21 1153 04/23/21 0630  AST 21 18  ALT 20 16  ALKPHOS 75 64  BILITOT 1.4* 1.3*  PROT 7.8 6.9  ALBUMIN 3.8 3.4*   No results for input(s): LIPASE, AMYLASE in the last 168 hours. No results for input(s): AMMONIA in the last 168 hours. Coagulation Profile: No  results for input(s): INR, PROTIME in the last 168 hours. Cardiac Enzymes: No results for input(s): CKTOTAL, CKMB, CKMBINDEX, TROPONINI in the last 168 hours. BNP (last 3 results) No results for input(s): PROBNP in the last 8760 hours. HbA1C: No results for input(s): HGBA1C in the last 72 hours. CBG: Recent Labs  Lab 04/22/21 2139 04/23/21 0812  GLUCAP 134* 81   Lipid Profile: No results for input(s): CHOL, HDL, LDLCALC, TRIG, CHOLHDL, LDLDIRECT in the last 72 hours. Thyroid Function Tests: No results for input(s): TSH, T4TOTAL, FREET4, T3FREE, THYROIDAB in the last 72 hours. Anemia Panel: No results for input(s): VITAMINB12, FOLATE, FERRITIN, TIBC, IRON, RETICCTPCT in the last 72 hours. Sepsis Labs: Recent Labs  Lab 04/22/21 1154  PROCALCITON <0.10    Recent Results (from the past 240 hour(s))  Resp Panel by RT-PCR (Flu A&B, Covid) Nasopharyngeal Swab     Status: None   Collection Time: 04/22/21  1:25 PM   Specimen: Nasopharyngeal Swab; Nasopharyngeal(NP) swabs in vial transport medium  Result Value Ref Range Status   SARS Coronavirus 2 by RT PCR NEGATIVE NEGATIVE Final    Comment: (NOTE) SARS-CoV-2 target nucleic acids are NOT DETECTED.  The SARS-CoV-2 RNA is generally detectable in upper respiratory specimens during the acute phase of infection. The lowest concentration of SARS-CoV-2 viral copies this assay can detect is 138 copies/mL. A negative result does not preclude SARS-Cov-2 infection and should not be used as the sole basis for treatment or other patient management decisions. A negative result may occur with  improper specimen collection/handling, submission of specimen other than nasopharyngeal swab, presence of viral mutation(s) within the areas targeted by this assay, and inadequate number of viral copies(<138 copies/mL). A negative result must be combined with clinical observations, patient history, and epidemiological information. The expected result is  Negative.  Fact Sheet for Patients:  BloggerCourse.comhttps://www.fda.gov/media/152166/download  Fact Sheet for Healthcare Providers:  SeriousBroker.ithttps://www.fda.gov/media/152162/download  This test is no t yet approved or cleared by the [place] FDA and  has been authorized for detection and/or diagnosis of SARS-CoV-2 by FDA under an Emergency Use Authorization (EUA). This EUA will remain  in effect (meaning this test can be used) for the duration of the COVID-19 declaration under Section 564(b)(1) of the Act, 21 U.S.C.section 360bbb-3(b)(1), unless the authorization is terminated  or revoked sooner.       Influenza A by PCR NEGATIVE NEGATIVE Final   Influenza B by PCR NEGATIVE NEGATIVE Final    Comment: (NOTE) The Xpert Xpress SARS-CoV-2/FLU/RSV plus assay is intended as an aid in the diagnosis of influenza from Nasopharyngeal swab specimens and should not be used as a sole basis for treatment. Nasal washings and aspirates are  unacceptable for Xpert Xpress SARS-CoV-2/FLU/RSV testing.  Fact Sheet for Patients: BloggerCourse.com  Fact Sheet for Healthcare Providers: SeriousBroker.it  This test is not yet approved or cleared by the [place] FDA and has been authorized for detection and/or diagnosis of SARS-CoV-2 by FDA under an Emergency Use Authorization (EUA). This EUA will remain in effect (meaning this test can be used) for the duration of the COVID-19 declaration under Section 564(b)(1) of the Act, 21 U.S.C. section 360bbb-3(b)(1), unless the authorization is terminated or revoked.  Performed at [facility], [address] [place], [place] [address]          Radiology Studies: DG Chest 2 View  Result Date: 04/22/2021 CLINICAL DATA:  Shortness of breath. EXAM: CHEST - 2 VIEW COMPARISON:  None. FINDINGS: Mild cardiomegaly is noted. No pneumothorax is noted. Minimal bibasilar subsegmental atelectasis or edema is noted  with small bilateral pleural effusions. Bony thorax is unremarkable. IMPRESSION: Minimal bibasilar subsegmental atelectasis or edema is noted with small bilateral pleural effusions. Aortic Atherosclerosis (ICD10-I70.0). Electronically Signed   By: Lupita Raider M.D.   On: 04/22/2021 12:32   CT Angio Chest PE W/Cm &/Or Wo Cm  Result Date: 04/22/2021 CLINICAL DATA:  Dyspnea. EXAM: CT ANGIOGRAPHY CHEST WITH CONTRAST TECHNIQUE: Multidetector CT imaging of the chest was performed using the standard protocol during bolus administration of intravenous contrast. Multiplanar CT image reconstructions and MIPs were obtained to evaluate the vascular anatomy. CONTRAST:  60mL OMNIPAQUE IOHEXOL 350 MG/ML SOLN COMPARISON:  Radiograph of same day. FINDINGS: Cardiovascular: Satisfactory opacification of the pulmonary arteries to the segmental level. No evidence of pulmonary embolism. Mild cardiomegaly is noted. Atherosclerosis of thoracic aorta is noted without aneurysm formation. Coronary artery calcifications are noted. No pericardial effusion. Mediastinum/Nodes: No enlarged mediastinal, hilar, or axillary lymph nodes. Thyroid gland, trachea, and esophagus demonstrate no significant findings. Lungs/Pleura: No pneumothorax is noted. Moderate size right pleural effusion is noted with adjacent subsegmental atelectasis of the right lower lobe. Small left pleural effusion is noted with adjacent subsegmental atelectasis of the left lower lobe. Focal left upper lobe opacity is noted peripherally consistent with subsegmental atelectasis or possibly pneumonia. Upper Abdomen: No acute abnormality. Musculoskeletal: No chest wall abnormality. No acute or significant osseous findings. Review of the MIP images confirms the above findings. IMPRESSION: No definite evidence of pulmonary embolus. Coronary artery calcifications are noted. Bilateral pleural effusions are noted with adjacent subsegmental atelectasis, right greater than left.  Small focal opacity is noted peripherally in the left upper lobe concerning for subsegmental atelectasis or possibly pneumonia. Aortic Atherosclerosis (ICD10-I70.0). Electronically Signed   By: Lupita Raider M.D.   On: 04/22/2021 14:14   ECHOCARDIOGRAM COMPLETE  Result Date: 04/23/2021    ECHOCARDIOGRAM REPORT   Patient Name:   Krystal Hoffman Date of Exam: 04/22/2021 Medical Rec #:  [id number]   Height:       67.0 in Accession #:    [id number]  Weight:       250.0 lb Date of Birth:  April 10, 1951   BSA:          2.223 m Patient Age:    69 years    BP:           132/97 mmHg Patient Gender: F           HR:           81 bpm. Exam Location:  [facility] Procedure: 2D Echo, Cardiac Doppler and Color Doppler Indications:     I50.21  Acute Systolic Heart Failure  History:         Patient has no prior history of Echocardiogram examinations.                  Risk Factors:Hypertension, Diabetes and Dyslipidemia. Anxiety.  Sonographer:     Daphine Deutscher RDCS Referring Phys:  [id number] Deno Lunger [facility] Diagnosing Phys: Alwyn Pea MD IMPRESSIONS  1. Borderline inferior Hypo. Left ventricular ejection fraction, by estimation, is 50 to 55%. The left ventricle has low normal function. The left ventricle demonstrates regional wall motion abnormalities (see scoring diagram/findings for description). Left ventricular diastolic parameters were normal.  2. Right ventricular systolic function is normal. The right ventricular size is normal.  3. The mitral valve is normal in structure. Mild mitral valve regurgitation.  4. The aortic valve is normal in structure. Aortic valve regurgitation is not visualized. FINDINGS  Left Ventricle: Borderline inferior Hypo. Left ventricular ejection fraction, by estimation, is 50 to 55%. The left ventricle has low normal function. The left ventricle demonstrates regional wall motion abnormalities. The left ventricular internal cavity size was normal in size. There is no left ventricular hypertrophy.  Left ventricular diastolic parameters were normal. Right Ventricle: The right ventricular size is normal. No increase in right ventricular wall thickness. Right ventricular systolic function is normal. Left Atrium: Left atrial size was normal in size. Right Atrium: Right atrial size was normal in size. Pericardium: There is no evidence of pericardial effusion. Mitral Valve: The mitral valve is normal in structure. Mild mitral valve regurgitation. Tricuspid Valve: The tricuspid valve is normal in structure. Tricuspid valve regurgitation is mild. Aortic Valve: The aortic valve is normal in structure. Aortic valve regurgitation is not visualized. Aortic valve mean gradient measures 9.4 mmHg. Aortic valve peak gradient measures 14.9 mmHg. Aortic valve area, by VTI measures 1.92 cm. Pulmonic Valve: The pulmonic valve was normal in structure. Pulmonic valve regurgitation is not visualized. Aorta: The ascending aorta was not well visualized. IAS/Shunts: No atrial level shunt detected by color flow Doppler.  LEFT VENTRICLE PLAX 2D LVIDd:         4.64 cm LVIDs:         3.33 cm LV PW:         1.13 cm LV IVS:        1.13 cm LVOT diam:     2.00 cm LV SV:         61 LV SV Index:   27 LVOT Area:     3.14 cm  RIGHT VENTRICLE             IVC RV Basal diam:  4.83 cm     IVC diam: 2.48 cm RV S prime:     13.40 cm/s TAPSE (M-mode): 2.6 cm LEFT ATRIUM             Index       RIGHT ATRIUM           Index LA diam:        4.70 cm 2.11 cm/m  RA Area:     18.50 cm LA Vol (A2C):   68.0 ml 30.58 ml/m RA Volume:   55.80 ml  25.10 ml/m LA Vol (A4C):   73.1 ml 32.88 ml/m LA Biplane Vol: 70.8 ml 31.84 ml/m  AORTIC VALVE AV Area (Vmax):    1.88 cm AV Area (Vmean):   1.74 cm AV Area (VTI):     1.92 cm AV Vmax:  192.80 cm/s AV Vmean:          147.000 cm/s AV VTI:            0.318 m AV Peak Grad:      14.9 mmHg AV Mean Grad:      9.4 mmHg LVOT Vmax:         115.50 cm/s LVOT Vmean:        81.300 cm/s LVOT VTI:          0.194 m  LVOT/AV VTI ratio: 0.61  AORTA Ao Root diam: 2.70 cm MV E velocity: 117.00 cm/s                             SHUNTS                             Systemic VTI:  0.19 m                             Systemic Diam: 2.00 cm Dwayne D Callwood MD Electronically signed by Alwyn Pea MD Signature Date/Time: 04/23/2021/7:58:54 AM    Final         Scheduled Meds:  amiodarone  200 mg Oral Daily   amLODipine  10 mg Oral Daily   atorvastatin  10 mg Oral Daily   FLUoxetine  20 mg Oral Daily   furosemide  40 mg Intravenous BID   insulin aspart  0-15 Units Subcutaneous TID AC & HS   latanoprost  1 drop Both Eyes QHS   losartan  100 mg Oral Daily   pantoprazole  40 mg Oral Daily   Continuous Infusions:   LOS: 1 day    Time spent: 34 mins     Charise Killian, MD [facility] Pager [phone]  If 7PM-7AM, please contact night-coverage 04/23/2021, 9:16 AM

## 2021-04-23 NOTE — Consult Note (Signed)
CARDIOLOGY CONSULT NOTE               Patient ID: Krystal Hoffman MRN: [id number] DOB/AGE: [birth year] 70 y.o.  Admit date: 04/22/2021 Referring Physician Dr. Shauna Hugh hospitalist Primary Physician Dr. Marcelino Duster primary Primary Cardiologist Arnoldo Hooker Reason for Consultation shortness of breath  HPI: Patient is a 70 year old female renal insufficiency hypertension hyperlipidemia depression GI reflux recently diagnosed with atrial fibrillation placed on anticoagulation with Eliquis patient developed shortness of breath and was found to have significant hematoma of her leg Eliquis.  Patient persistent shortness of breath came to emergency room for admission  Review of systems complete and found to be negative unless listed above     Past Medical History:  Diagnosis Date   Anxiety    Diabetes mellitus without complication (HCC)    GERD (gastroesophageal reflux disease)    High cholesterol    Hypercholesteremia    Hypertension    Lichen sclerosus    Vitamin D deficiency     Past Surgical History:  Procedure Laterality Date   SHOULDER SURGERY     tear   TUBAL LIGATION     1986    Medications Prior to Admission  Medication Sig Dispense Refill Last Dose   amiodarone (PACERONE) 200 MG tablet Take 200 mg by mouth daily.   04/21/2021 at 0900   amLODipine (NORVASC) 10 MG tablet Take 10 mg by mouth daily.   04/21/2021 at 0900   apixaban (ELIQUIS) 5 MG TABS tablet Take 5 mg by mouth 2 (two) times daily.   04/21/2021 at 1900   atorvastatin (LIPITOR) 10 MG tablet Take 10 mg by mouth daily.   04/21/2021 at Unknown   FLUoxetine (PROZAC) 20 MG capsule Take 20 mg by mouth daily.   04/21/2021 at Unknown   glimepiride (AMARYL) 4 MG tablet Take 2 mg by mouth 2 (two) times daily before a meal.   04/21/2021 at 1800   hydrochlorothiazide (MICROZIDE) 12.5 MG capsule Take 12.5 mg by mouth daily.   Unknown at Unknown   latanoprost (XALATAN) 0.005 % ophthalmic solution Place 1 drop into  both eyes at bedtime.   04/21/2021 at 2100   losartan (COZAAR) 100 MG tablet Take 100 mg by mouth daily.   04/21/2021 at Unknown   Multiple Vitamin (MULTIVITAMIN) capsule Take 1 capsule by mouth daily.      omeprazole (PRILOSEC) 20 MG capsule Take 20 mg by mouth daily.      sitaGLIPtin (JANUVIA) 100 MG tablet Take 100 mg by mouth daily.   04/21/2021 at Unknown   Vitamin D, Ergocalciferol, (DRISDOL) 1.25 MG (50000 UNIT) CAPS capsule Take 1 capsule (50,000 Units total) by mouth every 7 (seven) days. (Patient taking differently: Take 50,000 Units by mouth 2 (two) times a week.) 5 capsule     aspirin 325 MG tablet Take 1 tablet (325 mg total) by mouth 2 (two) times daily. (Patient not taking: No sig reported)   Not Taking   Social History   Socioeconomic History   Marital status: Married    Spouse name: Not on file   Number of children: Not on file   Years of education: Not on file   Highest education level: Not on file  Occupational History   Not on file  Tobacco Use   Smoking status: Never   Smokeless tobacco: Never  Vaping Use   Vaping Use: Never used  Substance and Sexual Activity   Alcohol use: No    Comment: Socially  Drug use: No   Sexual activity: Yes    Birth control/protection: Post-menopausal  Other Topics Concern   Not on file  Social History Narrative   ** Merged History Encounter **       Social Determinants of Health   Financial Resource Strain: Not on file  Food Insecurity: Not on file  Transportation Needs: Not on file  Physical Activity: Not on file  Stress: Not on file  Social Connections: Not on file  Intimate Partner Violence: Not on file    Family History  Problem Relation Age of Onset   Lung cancer Mother    Uterine cancer Mother    Brain cancer Father    Ovarian cancer Neg Hx    Drug abuse Neg Hx    Heart disease Neg Hx    Breast cancer Neg Hx    Colon cancer Neg Hx    Diabetes Neg Hx       Review of systems complete and found to be  negative unless listed above      PHYSICAL EXAM  General: Well developed, well nourished, in no acute distress HEENT:  Normocephalic and atramatic Neck:  No JVD.  Lungs: Clear bilaterally to auscultation and percussion. Heart: Irregular irregular. Normal S1 and S2 without gallops or murmurs.  Abdomen: Bowel sounds are positive, abdomen soft and non-tender  Msk:  Back normal, normal gait. Normal strength and tone for age. Extremities: No clubbing, cyanosis or edema.  Large hematoma of the right leg Neuro: Alert and oriented X 3. Psych:  Good affect, responds appropriately  Labs:   Lab Results  Component Value Date   WBC 5.7 04/23/2021   HGB 11.3 (L) 04/23/2021   HCT 38.1 04/23/2021   MCV 87.4 04/23/2021   PLT 281 04/23/2021    Recent Labs  Lab 04/23/21 0630  NA 145  K 4.2  CL 101  CO2 34*  BUN 37*  CREATININE 1.69*  CALCIUM 9.0  PROT 6.9  BILITOT 1.3*  ALKPHOS 64  ALT 16  AST 18  GLUCOSE 84   Lab Results  Component Value Date   CKTOTAL 90 04/14/2021   No results found for: CHOL No results found for: HDL No results found for: LDLCALC No results found for: TRIG No results found for: CHOLHDL No results found for: LDLDIRECT    Radiology: DG Chest 2 View  Result Date: 04/22/2021 CLINICAL DATA:  Shortness of breath. EXAM: CHEST - 2 VIEW COMPARISON:  None. FINDINGS: Mild cardiomegaly is noted. No pneumothorax is noted. Minimal bibasilar subsegmental atelectasis or edema is noted with small bilateral pleural effusions. Bony thorax is unremarkable. IMPRESSION: Minimal bibasilar subsegmental atelectasis or edema is noted with small bilateral pleural effusions. Aortic Atherosclerosis (ICD10-I70.0). Electronically Signed   By: Lupita RaiderJames  Green Jr M.D.   On: 04/22/2021 12:32   DG Tibia/Fibula Right  Result Date: 04/14/2021 CLINICAL DATA:  Left leg pain. History of ankle fracture 1 year ago. 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 Healed fracture of the distal fibular metaphysis. No acute fracture or dislocation. Severe lateral femorotibial compartment joint space narrowing. Moderate medial femorotibial compartment joint space narrowing. Severe patellofemoral compartment joint space narrowing. Generalized osteopenia. Generalized soft tissue edema of the right lower leg. Soft tissue swelling more focally around the ankle. IMPRESSION: 1.  No acute osseous injury of the right lower leg. 2. 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 3. Healed fracture  of the distal fibular metaphysis. Electronically Signed   By: Elige Ko M.D.   On: 04/14/2021 16:21   CT Angio Chest PE W/Cm &/Or Wo Cm  Result Date: 04/22/2021 CLINICAL DATA:  Dyspnea. EXAM: CT ANGIOGRAPHY CHEST WITH CONTRAST TECHNIQUE: Multidetector CT imaging of the chest was performed using the standard protocol during bolus administration of intravenous contrast. Multiplanar CT image reconstructions and MIPs were obtained to evaluate the vascular anatomy. CONTRAST:  42mL OMNIPAQUE IOHEXOL 350 MG/ML SOLN COMPARISON:  Radiograph of same day. FINDINGS: Cardiovascular: Satisfactory opacification of the pulmonary arteries to the segmental level. No evidence of pulmonary embolism. Mild cardiomegaly is noted. Atherosclerosis of thoracic aorta is noted without aneurysm formation. Coronary artery calcifications are noted. No pericardial effusion. Mediastinum/Nodes: No enlarged mediastinal, hilar, or axillary lymph nodes. Thyroid gland, trachea, and esophagus demonstrate no significant findings. Lungs/Pleura: No pneumothorax is noted. Moderate size right pleural effusion is noted with adjacent subsegmental atelectasis of the right lower lobe. Small left pleural effusion is noted with adjacent subsegmental atelectasis of the left lower lobe. Focal left upper lobe opacity is noted peripherally consistent with subsegmental  atelectasis or possibly pneumonia. Upper Abdomen: No acute abnormality. Musculoskeletal: No chest wall abnormality. No acute or significant osseous findings. Review of the MIP images confirms the above findings. IMPRESSION: No definite evidence of pulmonary embolus. Coronary artery calcifications are noted. Bilateral pleural effusions are noted with adjacent subsegmental atelectasis, right greater than left. Small focal opacity is noted peripherally in the left upper lobe concerning for subsegmental atelectasis or possibly pneumonia. Aortic Atherosclerosis (ICD10-I70.0). Electronically Signed   By: Lupita Raider M.D.   On: 04/22/2021 14:14   US Venous Img Lower Unilateral Right  Result Date: 04/14/2021 CLINICAL DATA:  Right lower extremity pain and edema the past 6 days after hitting leg on stool. Evaluate for DVT. EXAM: RIGHT LOWER EXTREMITY VENOUS DOPPLER ULTRASOUND TECHNIQUE: Gray-scale sonography with graded compression, as well as color Doppler and duplex ultrasound were performed to evaluate the lower extremity deep venous systems from the level of the common femoral vein and including the common femoral, femoral, profunda femoral, popliteal and calf veins including the posterior tibial, peroneal and gastrocnemius veins when visible. The superficial great saphenous vein was also interrogated. Spectral Doppler was utilized to evaluate flow at rest and with distal augmentation maneuvers in the common femoral, femoral and popliteal veins. COMPARISON:  Right lower extremity venous Doppler ultrasound-02/14/2008 (negative). FINDINGS: Contralateral Common Femoral Vein: Respiratory phasicity is normal and symmetric with the symptomatic side. No evidence of thrombus. Normal compressibility. Common Femoral Vein: No evidence of thrombus. Normal compressibility, respiratory phasicity and response to augmentation. Saphenofemoral Junction: No evidence of thrombus. Normal compressibility and flow on color Doppler  imaging. Profunda Femoral Vein: No evidence of thrombus. Normal compressibility and flow on color Doppler imaging. Femoral Vein: No evidence of thrombus. Normal compressibility, respiratory phasicity and response to augmentation. Popliteal Vein: No evidence of thrombus. Normal compressibility, respiratory phasicity and response to augmentation. Calf Veins: No evidence of thrombus. Normal compressibility and flow on color Doppler imaging. Superficial Great Saphenous Vein: No evidence of thrombus. Normal compressibility. Venous Reflux:  None. Other Findings: Sonographic evaluation of the patient's area of bruising involving the medial aspect the right calf correlates with an approximately 6.7 x 6.2 x 2.7 cm bilobed hypoechoic apparent collection. IMPRESSION: 1. No evidence of DVT within the right lower extremity. 2. Sonographic area evaluation of patient's area of bruising involving the medial aspect of right calf correlates with an approximately  6.7 cm bilobed hypoechoic apparent collection, nonspecific though given provided history of recent injury may represent evolving hematoma. Clinical correlation is advised. Electronically Signed   By: Simonne Come M.D.   On: 04/14/2021 14:54   ECHOCARDIOGRAM COMPLETE  Result Date: 04/23/2021    ECHOCARDIOGRAM REPORT   Patient Name:   SILVER ACHEY Date of Exam: 04/22/2021 Medical Rec #:  [id number]   Height:       67.0 in Accession #:    [id number]  Weight:       250.0 lb Date of Birth:  Sep 21, 1950   BSA:          2.223 m Patient Age:    69 years    BP:           132/97 mmHg Patient Gender: F           HR:           81 bpm. Exam Location:  [facility] Procedure: 2D Echo, Cardiac Doppler and Color Doppler Indications:     I50.21 Acute Systolic Heart Failure  History:         Patient has no prior history of Echocardiogram examinations.                  Risk Factors:Hypertension, Diabetes and Dyslipidemia. Anxiety.  Sonographer:     Daphine Deutscher RDCS Referring Phys:  [id number]  Deno Lunger [facility] Diagnosing Phys: Alwyn Pea MD IMPRESSIONS  1. Borderline inferior Hypo. Left ventricular ejection fraction, by estimation, is 50 to 55%. The left ventricle has low normal function. The left ventricle demonstrates regional wall motion abnormalities (see scoring diagram/findings for description). Left ventricular diastolic parameters were normal.  2. Right ventricular systolic function is normal. The right ventricular size is normal.  3. The mitral valve is normal in structure. Mild mitral valve regurgitation.  4. The aortic valve is normal in structure. Aortic valve regurgitation is not visualized. FINDINGS  Left Ventricle: Borderline inferior Hypo. Left ventricular ejection fraction, by estimation, is 50 to 55%. The left ventricle has low normal function. The left ventricle demonstrates regional wall motion abnormalities. The left ventricular internal cavity size was normal in size. There is no left ventricular hypertrophy. Left ventricular diastolic parameters were normal. Right Ventricle: The right ventricular size is normal. No increase in right ventricular wall thickness. Right ventricular systolic function is normal. Left Atrium: Left atrial size was normal in size. Right Atrium: Right atrial size was normal in size. Pericardium: There is no evidence of pericardial effusion. Mitral Valve: The mitral valve is normal in structure. Mild mitral valve regurgitation. Tricuspid Valve: The tricuspid valve is normal in structure. Tricuspid valve regurgitation is mild. Aortic Valve: The aortic valve is normal in structure. Aortic valve regurgitation is not visualized. Aortic valve mean gradient measures 9.4 mmHg. Aortic valve peak gradient measures 14.9 mmHg. Aortic valve area, by VTI measures 1.92 cm. Pulmonic Valve: The pulmonic valve was normal in structure. Pulmonic valve regurgitation is not visualized. Aorta: The ascending aorta was not well visualized. IAS/Shunts: No atrial level shunt  detected by color flow Doppler.  LEFT VENTRICLE PLAX 2D LVIDd:         4.64 cm LVIDs:         3.33 cm LV PW:         1.13 cm LV IVS:        1.13 cm LVOT diam:     2.00 cm LV SV:         61  LV SV Index:   27 LVOT Area:     3.14 cm  RIGHT VENTRICLE             IVC RV Basal diam:  4.83 cm     IVC diam: 2.48 cm RV S prime:     13.40 cm/s TAPSE (M-mode): 2.6 cm LEFT ATRIUM             Index       RIGHT ATRIUM           Index LA diam:        4.70 cm 2.11 cm/m  RA Area:     18.50 cm LA Vol (A2C):   68.0 ml 30.58 ml/m RA Volume:   55.80 ml  25.10 ml/m LA Vol (A4C):   73.1 ml 32.88 ml/m LA Biplane Vol: 70.8 ml 31.84 ml/m  AORTIC VALVE AV Area (Vmax):    1.88 cm AV Area (Vmean):   1.74 cm AV Area (VTI):     1.92 cm AV Vmax:           192.80 cm/s AV Vmean:          147.000 cm/s AV VTI:            0.318 m AV Peak Grad:      14.9 mmHg AV Mean Grad:      9.4 mmHg LVOT Vmax:         115.50 cm/s LVOT Vmean:        81.300 cm/s LVOT VTI:          0.194 m LVOT/AV VTI ratio: 0.61  AORTA Ao Root diam: 2.70 cm MV E velocity: 117.00 cm/s                             SHUNTS                             Systemic VTI:  0.19 m                             Systemic Diam: 2.00 cm Niv Darley D Amberle Lyter MD Electronically signed by Alwyn Pea MD Signature Date/Time: 04/23/2021/7:58:54 AM    Final     EKG: Atrial fibrillation rate of 75 nonspecific ST-T changes right bundle branch block left anterior fascicular block  ASSESSMENT AND PLAN:  Shortness of breath Chronic renal insufficiency Hypertension Hyperlipidemia Atrial fibrillation Traumatic hematoma Anemia Diabetes . Plan Acute on chronic congestive heart failure recommend mild diuresis Will hold Eliquis because of hematoma continue amiodarone for rhythm control Obtain echocardiogram for assessment left ventricular function wall motion Maintain the patient on telemetry Elevate lower extremity light Ace wrap Continue diabetes management and control Consider  nephrology input for renal insufficiency Recommend PPI for GERD type symptoms  Signed: Alwyn Pea MD 04/23/2021, 6:43 PM

## 2021-04-24 DIAGNOSIS — J810 Acute pulmonary edema: Secondary | ICD-10-CM | POA: Diagnosis not present

## 2021-04-24 DIAGNOSIS — S8011XD Contusion of right lower leg, subsequent encounter: Secondary | ICD-10-CM | POA: Diagnosis not present

## 2021-04-24 DIAGNOSIS — I4819 Other persistent atrial fibrillation: Secondary | ICD-10-CM | POA: Diagnosis not present

## 2021-04-24 LAB — BASIC METABOLIC PANEL
Anion gap: 10 (ref 5–15)
BUN: 35 mg/dL — ABNORMAL HIGH (ref 8–23)
CO2: 32 mmol/L (ref 22–32)
Calcium: 8.5 mg/dL — ABNORMAL LOW (ref 8.9–10.3)
Chloride: 98 mmol/L (ref 98–111)
Creatinine, Ser: 1.84 mg/dL — ABNORMAL HIGH (ref 0.44–1.00)
GFR, Estimated: 29 mL/min — ABNORMAL LOW (ref >60)
Glucose, Bld: 95 mg/dL (ref 70–99)
Potassium: 4.1 mmol/L (ref 3.5–5.1)
Sodium: 140 mmol/L (ref 135–145)

## 2021-04-24 LAB — GLUCOSE, CAPILLARY
Glucose-Capillary: 118 mg/dL — ABNORMAL HIGH (ref 70–99)
Glucose-Capillary: 141 mg/dL — ABNORMAL HIGH (ref 70–99)
Glucose-Capillary: 78 mg/dL (ref 70–99)
Glucose-Capillary: 190 mg/dL — ABNORMAL HIGH (ref 70–99)

## 2021-04-24 LAB — CBC
HCT: 36.5 % (ref 36.0–46.0)
Hemoglobin: 10.7 g/dL — ABNORMAL LOW (ref 12.0–15.0)
MCH: 25.3 pg — ABNORMAL LOW (ref 26.0–34.0)
MCHC: 29.3 g/dL — ABNORMAL LOW (ref 30.0–36.0)
MCV: 86.3 fL (ref 80.0–100.0)
Platelets: 266 10*3/uL (ref 150–400)
RBC: 4.23 MIL/uL (ref 3.87–5.11)
RDW: 16.8 % — ABNORMAL HIGH (ref 11.5–15.5)
WBC: 6.3 10*3/uL (ref 4.0–10.5)
nRBC: 0 % (ref 0.0–0.2)

## 2021-04-24 MED ORDER — FUROSEMIDE 10 MG/ML IJ SOLN
40.0000 mg | Freq: Every day | INTRAMUSCULAR | Status: DC
  Administered 2021-04-25 – 2021-04-26 (×2): 40 mg via INTRAVENOUS
  Filled 2021-04-24 (×2): qty 4

## 2021-04-24 MED ORDER — TRAMADOL HCL 50 MG PO TABS: 50.0000 mg | ORAL_TABLET | Freq: Four times a day (QID) | ORAL | Status: DC | PRN

## 2021-04-24 MED ORDER — OXYCODONE-ACETAMINOPHEN 5-325 MG PO TABS
1.0000 | ORAL_TABLET | Freq: Four times a day (QID) | ORAL | Status: DC | PRN
  Administered 2021-04-24 – 2021-04-29 (×8): 1 via ORAL
  Filled 2021-04-24 (×9): qty 1

## 2021-04-24 MED ORDER — METOPROLOL TARTRATE 25 MG PO TABS
25.0000 mg | ORAL_TABLET | Freq: Two times a day (BID) | ORAL | Status: DC
  Administered 2021-04-24 – 2021-04-25 (×4): 25 mg via ORAL
  Filled 2021-04-24 (×5): qty 1

## 2021-04-24 MED ORDER — DILTIAZEM HCL 25 MG/5ML IV SOLN
10.0000 mg | Freq: Once | INTRAVENOUS | Status: AC
  Administered 2021-04-24: 10 mg via INTRAVENOUS
  Filled 2021-04-24: qty 5

## 2021-04-24 NOTE — Progress Notes (Signed)
[facility] Cardiology    SUBJECTIVE: Patient doing reasonably well tachycardic palpitations with right leg pain patient was bradycardic yesterday but now appears to have rapid atrial fibrillation.  Patient complains of persistent right leg discomfort   Vitals:   04/24/21 0658 04/24/21 0841 04/24/21 1117 04/24/21 1123  BP:  107/62 112/64   Pulse:  (!) 59 64   Resp:   16   Temp:  (!) 97.5 F (36.4 C) 97.7 F (36.5 C)   TempSrc:  Oral Oral   SpO2:  92% 98% 90%  Weight: 118.3 kg     Height:         Intake/Output Summary (Last 24 hours) at 04/24/2021 1242 Last data filed at 04/24/2021 1140 Gross per 24 hour  Intake 720 ml  Output 1100 ml  Net -380 ml      PHYSICAL EXAM  General: Well developed, well nourished, in no acute distress HEENT:  Normocephalic and atramatic Neck:  No JVD.  Lungs: Clear bilaterally to auscultation and percussion. Heart: Tachycardic normal S1 and S2 without gallops or murmurs.  Abdomen: Bowel sounds are positive, abdomen soft and non-tender  Msk:  Back normal, normal gait. Normal strength and tone for age. Extremities: No clubbing, cyanosis or edema.  Ecchymosis hematoma right swollen leg Neuro: Alert and oriented X 3. Psych:  Good affect, responds appropriately   LABS: Basic Metabolic Panel: Recent Labs    04/23/21 0630 04/24/21 0428  NA 145 140  K 4.2 4.1  CL 101 98  CO2 34* 32  GLUCOSE 84 95  BUN 37* 35*  CREATININE 1.69* 1.84*  CALCIUM 9.0 8.5*  MG 2.2  --    Liver Function Tests: Recent Labs    04/22/21 1153 04/23/21 0630  AST 21 18  ALT 20 16  ALKPHOS 75 64  BILITOT 1.4* 1.3*  PROT 7.8 6.9  ALBUMIN 3.8 3.4*   No results for input(s): LIPASE, AMYLASE in the last 72 hours. CBC: Recent Labs    04/22/21 1153 04/23/21 0630 04/24/21 0428  WBC 6.5 5.7 6.3  NEUTROABS 5.3 4.0  --   HGB 12.5 11.3* 10.7*  HCT 41.2 38.1 36.5  MCV 87.5 87.4 86.3  PLT 315 281 266   Cardiac Enzymes: No results for input(s): CKTOTAL, CKMB,  CKMBINDEX, TROPONINI in the last 72 hours. BNP: Invalid input(s): POCBNP D-Dimer: No results for input(s): DDIMER in the last 72 hours. Hemoglobin A1C: Recent Labs    04/23/21 0630  HGBA1C 6.2*   Fasting Lipid Panel: No results for input(s): CHOL, HDL, LDLCALC, TRIG, CHOLHDL, LDLDIRECT in the last 72 hours. Thyroid Function Tests: No results for input(s): TSH, T4TOTAL, T3FREE, THYROIDAB in the last 72 hours.  Invalid input(s): FREET3 Anemia Panel: No results for input(s): VITAMINB12, FOLATE, FERRITIN, TIBC, IRON, RETICCTPCT in the last 72 hours.  CT Angio Chest PE W/Cm &/Or Wo Cm  Result Date: 04/22/2021 CLINICAL DATA:  Dyspnea. EXAM: CT ANGIOGRAPHY CHEST WITH CONTRAST TECHNIQUE: Multidetector CT imaging of the chest was performed using the standard protocol during bolus administration of intravenous contrast. Multiplanar CT image reconstructions and MIPs were obtained to evaluate the vascular anatomy. CONTRAST:  42mL OMNIPAQUE IOHEXOL 350 MG/ML SOLN COMPARISON:  Radiograph of same day. FINDINGS: Cardiovascular: Satisfactory opacification of the pulmonary arteries to the segmental level. No evidence of pulmonary embolism. Mild cardiomegaly is noted. Atherosclerosis of thoracic aorta is noted without aneurysm formation. Coronary artery calcifications are noted. No pericardial effusion. Mediastinum/Nodes: No enlarged mediastinal, hilar, or axillary lymph nodes. Thyroid gland, trachea,  and esophagus demonstrate no significant findings. Lungs/Pleura: No pneumothorax is noted. Moderate size right pleural effusion is noted with adjacent subsegmental atelectasis of the right lower lobe. Small left pleural effusion is noted with adjacent subsegmental atelectasis of the left lower lobe. Focal left upper lobe opacity is noted peripherally consistent with subsegmental atelectasis or possibly pneumonia. Upper Abdomen: No acute abnormality. Musculoskeletal: No chest wall abnormality. No acute or significant  osseous findings. Review of the MIP images confirms the above findings. IMPRESSION: No definite evidence of pulmonary embolus. Coronary artery calcifications are noted. Bilateral pleural effusions are noted with adjacent subsegmental atelectasis, right greater than left. Small focal opacity is noted peripherally in the left upper lobe concerning for subsegmental atelectasis or possibly pneumonia. Aortic Atherosclerosis (ICD10-I70.0). Electronically Signed   By: Lupita RaiderJames  Green Jr M.D.   On: 04/22/2021 14:14   ECHOCARDIOGRAM COMPLETE  Result Date: 04/23/2021    ECHOCARDIOGRAM REPORT   Patient Name:   Krystal Hoffman Date of Exam: 04/22/2021 Medical Rec #:  [id number]   Height:       67.0 in Accession #:    [id number]  Weight:       250.0 lb Date of Birth:  10/13/1950   BSA:          2.223 m Patient Age:    69 years    BP:           132/97 mmHg Patient Gender: F           HR:           81 bpm. Exam Location:  [facility] Procedure: 2D Echo, Cardiac Doppler and Color Doppler Indications:     I50.21 Acute Systolic Heart Failure  History:         Patient has no prior history of Echocardiogram examinations.                  Risk Factors:Hypertension, Diabetes and Dyslipidemia. Anxiety.  Sonographer:     Daphine DeutscheraTashia Rodgers-Jones RDCS Referring Phys:  [id number] Deno LungerGEORGE J [facility] SystemHALHOUB Diagnosing Phys: Alwyn Peawayne D Thao Vanover MD IMPRESSIONS  1. Borderline inferior Hypo. Left ventricular ejection fraction, by estimation, is 50 to 55%. The left ventricle has low normal function. The left ventricle demonstrates regional wall motion abnormalities (see scoring diagram/findings for description). Left ventricular diastolic parameters were normal.  2. Right ventricular systolic function is normal. The right ventricular size is normal.  3. The mitral valve is normal in structure. Mild mitral valve regurgitation.  4. The aortic valve is normal in structure. Aortic valve regurgitation is not visualized. FINDINGS  Left Ventricle: Borderline inferior Hypo. Left  ventricular ejection fraction, by estimation, is 50 to 55%. The left ventricle has low normal function. The left ventricle demonstrates regional wall motion abnormalities. The left ventricular internal cavity size was normal in size. There is no left ventricular hypertrophy. Left ventricular diastolic parameters were normal. Right Ventricle: The right ventricular size is normal. No increase in right ventricular wall thickness. Right ventricular systolic function is normal. Left Atrium: Left atrial size was normal in size. Right Atrium: Right atrial size was normal in size. Pericardium: There is no evidence of pericardial effusion. Mitral Valve: The mitral valve is normal in structure. Mild mitral valve regurgitation. Tricuspid Valve: The tricuspid valve is normal in structure. Tricuspid valve regurgitation is mild. Aortic Valve: The aortic valve is normal in structure. Aortic valve regurgitation is not visualized. Aortic valve mean gradient measures 9.4 mmHg. Aortic valve peak gradient measures 14.9 mmHg. Aortic valve area,  by VTI measures 1.92 cm. Pulmonic Valve: The pulmonic valve was normal in structure. Pulmonic valve regurgitation is not visualized. Aorta: The ascending aorta was not well visualized. IAS/Shunts: No atrial level shunt detected by color flow Doppler.  LEFT VENTRICLE PLAX 2D LVIDd:         4.64 cm LVIDs:         3.33 cm LV PW:         1.13 cm LV IVS:        1.13 cm LVOT diam:     2.00 cm LV SV:         61 LV SV Index:   27 LVOT Area:     3.14 cm  RIGHT VENTRICLE             IVC RV Basal diam:  4.83 cm     IVC diam: 2.48 cm RV S prime:     13.40 cm/s TAPSE (M-mode): 2.6 cm LEFT ATRIUM             Index       RIGHT ATRIUM           Index LA diam:        4.70 cm 2.11 cm/m  RA Area:     18.50 cm LA Vol (A2C):   68.0 ml 30.58 ml/m RA Volume:   55.80 ml  25.10 ml/m LA Vol (A4C):   73.1 ml 32.88 ml/m LA Biplane Vol: 70.8 ml 31.84 ml/m  AORTIC VALVE AV Area (Vmax):    1.88 cm AV Area (Vmean):    1.74 cm AV Area (VTI):     1.92 cm AV Vmax:           192.80 cm/s AV Vmean:          147.000 cm/s AV VTI:            0.318 m AV Peak Grad:      14.9 mmHg AV Mean Grad:      9.4 mmHg LVOT Vmax:         115.50 cm/s LVOT Vmean:        81.300 cm/s LVOT VTI:          0.194 m LVOT/AV VTI ratio: 0.61  AORTA Ao Root diam: 2.70 cm MV E velocity: 117.00 cm/s                             SHUNTS                             Systemic VTI:  0.19 m                             Systemic Diam: 2.00 cm Krystal Hobbs D Apryll Hinkle MD Electronically signed by Alwyn Pea MD Signature Date/Time: 04/23/2021/7:58:54 AM    Final      Echo preserved left ventricular function function 50-55%  TELEMETRY: Rapid atrial fibrillation/atrial flutter rate of 140:  ASSESSMENT AND PLAN:  Principal Problem:   Acute on chronic congestive heart failure (HCC) Active Problems:   Type 2 diabetes mellitus with stage 3b chronic kidney disease, without long-term current use of insulin (HCC)   GERD without esophagitis   Essential hypertension   Chronic kidney disease, stage 3b (HCC)   Mixed diabetic hyperlipidemia associated with type 2 diabetes mellitus (HCC)   Persistent atrial  fibrillation (HCC)    Plan Recommend tramadol for leg pain Continue elevation possibly compression stockings for right leg hematoma Continue to withhold anticoagulation until hematoma resolves Recommend nephrology input for renal insufficiency Continue diabetes management and control Atrial fibrillation rate appears to be accelerated at this point and persistent we will continue amiodarone therapy consider resuming diltiazem or beta-blocker therapy for rate control Maintain statin therapy for hyperlipidemia Continue GERD prophylaxis therapy with Protonix   Alwyn Pea, MD 04/24/2021 12:42 PM

## 2021-04-24 NOTE — Evaluation (Signed)
Physical Therapy Evaluation Patient Details Name: Krystal Hoffman MRN: [id number] DOB: 11-Mar-1951 Today's Date: 04/24/2021   History of Present Illness  Krystal Hoffman is a 69yoF who [facility] c SOB. Pt here on 8/15 in ED for RLE pain/swelling after injury during yardwork in the setting of eliquis use.    PMH: depression, CKD3b, DM, HLD, HTN, Rt ankle fracture. Imaging revealing of large pleural effusion, ?PNA.  Clinical Impression  Pt admitted with above diagnosis. Pt currently with functional limitations due to the deficits listed below (see "PT Problem List"). Upon entry, pt in bed, awake and agreeable to participate. The pt is alert, pleasant, interactive, and able to provide info regarding prior level of function, both in tolerance and independence. Performs bed mobility with ease, transfers and AMB c RW at modI level. Doe snot use RW at baseline, but has been as of late for pain management of Right foreleg hematoma pain. Pt has DOE that triggers a rest break at 180ft of AMB, delay in getting an accurate SpO2 due to poor signal, then later is able to AMB on room air 162ft, SpO2 at 90%, HR 140bpm. Pt denies any dyspnea over this shorter trial. RN/MD aware of tachycardia. Patient's performance this date reveals decreased ability, independence, and tolerance in performing all basic mobility required for performance of activities of daily living. Pt requires additional DME, close physical assistance, and cues for safe participate in mobility. Pt will benefit from skilled PT intervention to increase independence and safety with basic mobility in preparation for discharge to the venue listed below.       Follow Up Recommendations No PT follow up    Equipment Recommendations  None recommended by PT    Recommendations for Other Services       Precautions / Restrictions Precautions Precautions: Fall Restrictions Weight Bearing Restrictions: No      Mobility  Bed Mobility Overal bed  mobility: Independent                  Transfers Overall transfer level: Independent Equipment used: Rolling walker (2 wheeled)             General transfer comment: RW for pain management  Ambulation/Gait Ambulation/Gait assistance: Modified independent (Device/Increase time) Gait Distance (Feet): 240 Feet Assistive device: Rolling walker (2 wheeled) Gait Pattern/deviations: WFL(Within Functional Limits);Antalgic Gait velocity: 0.63m/s   General Gait Details: elects to perform toe touch weightbearing RLE for pain control  Stairs            Wheelchair Mobility    Modified Rankin (Stroke Patients Only)       Balance Overall balance assessment: Modified Independent                                           Pertinent Vitals/Pain Pain Assessment: 0-10 Pain Score: 9  Pain Location: Right lower leg 2/2/ subacute hematoma Pain Intervention(s): Limited activity within patient's tolerance;Monitored during session;Premedicated before session;Repositioned    Home Living Family/patient expects to be discharged to:: Private residence Living Arrangements: Spouse/significant other Available Help at Discharge: Family Type of Home: House Home Access: Stairs to enter Entrance Stairs-Rails: Doctor, general practice of Steps: 6-8 Home Layout: One level Home Equipment: Environmental consultant - 2 wheels;Cane - single point;Shower seat;Crutches;Wheelchair - manual;Bedside commode      Prior Function Level of Independence: Independent  Comments: a returned to PLOF     Hand Dominance   Dominant Hand: Right    Extremity/Trunk Assessment                Communication      Cognition Arousal/Alertness: Awake/alert Behavior During Therapy: WFL for tasks assessed/performed Overall Cognitive Status: Within Functional Limits for tasks assessed                                        General Comments      Exercises      Assessment/Plan    PT Assessment Patient needs continued PT services  PT Problem List Cardiopulmonary status limiting activity;Decreased mobility;Decreased balance;Decreased range of motion;Decreased strength       PT Treatment Interventions Stair training;Functional mobility training;Therapeutic exercise;Patient/family education    PT Goals (Current goals can be found in the Care Plan section)  Acute Rehab PT Goals Patient Stated Goal: control RLE pain, remain safe with RW AMB of home PT Goal Formulation: With patient Time For Goal Achievement: 05/08/21 Potential to Achieve Goals: Good    Frequency Min 2X/week   Barriers to discharge        Co-evaluation               AM-PAC PT "6 Clicks" Mobility  Outcome Measure Help needed turning from your back to your side while in a flat bed without using bedrails?: None Help needed moving from lying on your back to sitting on the side of a flat bed without using bedrails?: None Help needed moving to and from a bed to a chair (including a wheelchair)?: None Help needed standing up from a chair using your arms (e.g., wheelchair or bedside chair)?: None Help needed to walk in hospital room?: None Help needed climbing 3-5 steps with a railing? : A Little 6 Click Score: 23    End of Session Equipment Utilized During Treatment: Oxygen Activity Tolerance: Patient tolerated treatment well Patient left: in bed;with family/visitor present Nurse Communication: Mobility status PT Visit Diagnosis: Difficulty in walking, not elsewhere classified (R26.2)    Time: 1610-9604 PT Time Calculation (min) (ACUTE ONLY): 25 min   Charges:   PT Evaluation $PT Eval Moderate Complexity: 1 Mod     11:49 AM, 04/24/21 Rosamaria Lints, PT, DPT Physical Therapist - [facility]  [phone] (ASCOM)    Leith Szafranski C 04/24/2021, 11:45 AM

## 2021-04-24 NOTE — Progress Notes (Signed)
PROGRESS NOTE    Krystal Hoffman  GGY:[id number] DOB: [birth year] DOA: 04/22/2021 PCP: Gracelyn Nurse, MD    Assessment & Plan:   Principal Problem:   Acute on chronic congestive heart failure (HCC) Active Problems:   Type 2 diabetes mellitus with stage 3b chronic kidney disease, without long-term current use of insulin (HCC)   GERD without esophagitis   Essential hypertension   Chronic kidney disease, stage 3b (HCC)   Mixed diabetic hyperlipidemia associated with type 2 diabetes mellitus (HCC)   Persistent atrial fibrillation (HCC)  Pulmonary edema: etiology unclear. Echo shows EF 50-55%, normal diastolic function. No hx of CHF but has nocturnal dyspnea, orthopnea & mild elevation of BNP. Continue on IV lasix. Monitor I/Os.   Chronic atrial fibrillation: continue to hold eliquis secondary to RLE hematoma. Consider starting xarelto instead when stable to restart anticoagulation. Continue on home dose of amiodarone. IV dilt x 1 given for RVR. Cardio following and recs apprec    Right lower extremity hematoma: slight improvement from day prior. Secondary to a bench falling on RLE. [place] of RLE was neg for DVT but positive for likely hematoma on 8/15.    DM2: well controlled w/ HbA1c 6.2. Continue on SSI w/ accuchecks   CKDIIIb: Cr is trending from day prior, possibly secondary to lasix use. Will hold home dose of losartan.    GERD : continue on PPI    HTN: continue on amio. Hold losartan     HLD: continue on statin   DVT prophylaxis: SCDs of LLE only  Code Status: full  Family Communication:  Disposition Plan: likely d/c back home.  Level of care: Progressive Cardiac  Status is: Inpatient  Remains inpatient appropriate because:IV treatments appropriate due to intensity of illness or inability to take PO and Inpatient level of care appropriate due to severity of illness  Dispo: The patient is from: Home              Anticipated d/c is to: Home              Patient currently is  not medically stable to d/c.   Difficult to place patient : unclear       Consultants:  cardio  Procedures:   Antimicrobials:   Subjective: Pt c/o RLE pain    Objective: Vitals:   04/23/21 2113 04/23/21 2353 04/24/21 0432 04/24/21 0658  BP:  (!) 99/55 (!) 118/50   Pulse: 87 77 74   Resp:  19 17   Temp:  98.4 F (36.9 C) 97.8 F (36.6 C)   TempSrc:  Oral Oral   SpO2: 99% 98% 90%   Weight:    118.3 kg  Height:        Intake/Output Summary (Last 24 hours) at 04/24/2021 0833 Last data filed at 04/23/2021 2200 Gross per 24 hour  Intake 240 ml  Output --  Net 240 ml   Filed Weights   04/22/21 1132 04/24/21 0658  Weight: 113.4 kg 118.3 kg    Examination:  General exam: Appears comfortable. Morbidly obese Respiratory system: decreased breath sounds b/l  Cardiovascular system: irregularly irregular. No rubs or clicks  Gastrointestinal system: Abd is soft, NT, obese & hypoactive bowel sounds  Central nervous system: Alert and oriented. Moves all extremities  Psychiatry: Judgement and insight appear normal. Flat mood and affect    Data Reviewed: I have personally reviewed following labs and imaging studies  CBC: Recent Labs  Lab 04/22/21 1153 04/23/21 0630 04/24/21 0428  WBC 6.5 5.7 6.3  NEUTROABS 5.3 4.0  --   HGB 12.5 11.3* 10.7*  HCT 41.2 38.1 36.5  MCV 87.5 87.4 86.3  PLT 315 281 266   Basic Metabolic Panel: Recent Labs  Lab 04/22/21 1153 04/23/21 0630 04/24/21 0428  NA 140 145 140  K 4.4 4.2 4.1  CL 102 101 98  CO2 30 34* 32  GLUCOSE 129* 84 95  BUN 39* 37* 35*  CREATININE 1.65* 1.69* 1.84*  CALCIUM 9.2 9.0 8.5*  MG  --  2.2  --    GFR: Estimated Creatinine Clearance: 38.4 mL/min (A) (by C-G formula based on SCr of 1.84 mg/dL (H)). Liver Function Tests: Recent Labs  Lab 04/22/21 1153 04/23/21 0630  AST 21 18  ALT 20 16  ALKPHOS 75 64  BILITOT 1.4* 1.3*  PROT 7.8 6.9  ALBUMIN 3.8 3.4*   No results for input(s): LIPASE,  AMYLASE in the last 168 hours. No results for input(s): AMMONIA in the last 168 hours. Coagulation Profile: No results for input(s): INR, PROTIME in the last 168 hours. Cardiac Enzymes: No results for input(s): CKTOTAL, CKMB, CKMBINDEX, TROPONINI in the last 168 hours. BNP (last 3 results) No results for input(s): PROBNP in the last 8760 hours. HbA1C: Recent Labs    04/23/21 0630  HGBA1C 6.2*   CBG: Recent Labs  Lab 04/22/21 2139 04/23/21 0812 04/23/21 1228 04/23/21 1641 04/23/21 2031  GLUCAP 134* 81 137* 124* 119*   Lipid Profile: No results for input(s): CHOL, HDL, LDLCALC, TRIG, CHOLHDL, LDLDIRECT in the last 72 hours. Thyroid Function Tests: No results for input(s): TSH, T4TOTAL, FREET4, T3FREE, THYROIDAB in the last 72 hours. Anemia Panel: No results for input(s): VITAMINB12, FOLATE, FERRITIN, TIBC, IRON, RETICCTPCT in the last 72 hours. Sepsis Labs: Recent Labs  Lab 04/22/21 1154  PROCALCITON <0.10    Recent Results (from the past 240 hour(s))  Resp Panel by RT-PCR (Flu A&B, Covid) Nasopharyngeal Swab     Status: None   Collection Time: 04/22/21  1:25 PM   Specimen: Nasopharyngeal Swab; Nasopharyngeal(NP) swabs in vial transport medium  Result Value Ref Range Status   SARS Coronavirus 2 by RT PCR NEGATIVE NEGATIVE Final    Comment: (NOTE) SARS-CoV-2 target nucleic acids are NOT DETECTED.  The SARS-CoV-2 RNA is generally detectable in upper respiratory specimens during the acute phase of infection. The lowest concentration of SARS-CoV-2 viral copies this assay can detect is 138 copies/mL. A negative result does not preclude SARS-Cov-2 infection and should not be used as the sole basis for treatment or other patient management decisions. A negative result may occur with  improper specimen collection/handling, submission of specimen other than nasopharyngeal swab, presence of viral mutation(s) within the areas targeted by this assay, and inadequate number of  viral copies(<138 copies/mL). A negative result must be combined with clinical observations, patient history, and epidemiological information. The expected result is Negative.  Fact Sheet for Patients:  BloggerCourse.com  Fact Sheet for Healthcare Providers:  SeriousBroker.it  This test is no t yet approved or cleared by the [place] FDA and  has been authorized for detection and/or diagnosis of SARS-CoV-2 by FDA under an Emergency Use Authorization (EUA). This EUA will remain  in effect (meaning this test can be used) for the duration of the COVID-19 declaration under Section 564(b)(1) of the Act, 21 U.S.C.section 360bbb-3(b)(1), unless the authorization is terminated  or revoked sooner.       Influenza A by PCR NEGATIVE NEGATIVE Final  Influenza B by PCR NEGATIVE NEGATIVE Final    Comment: (NOTE) The Xpert Xpress SARS-CoV-2/FLU/RSV plus assay is intended as an aid in the diagnosis of influenza from Nasopharyngeal swab specimens and should not be used as a sole basis for treatment. Nasal washings and aspirates are unacceptable for Xpert Xpress SARS-CoV-2/FLU/RSV testing.  Fact Sheet for Patients: BloggerCourse.comhttps://www.fda.gov/media/152166/download  Fact Sheet for Healthcare Providers: SeriousBroker.ithttps://www.fda.gov/media/152162/download  This test is not yet approved or cleared by the [place] FDA and has been authorized for detection and/or diagnosis of SARS-CoV-2 by FDA under an Emergency Use Authorization (EUA). This EUA will remain in effect (meaning this test can be used) for the duration of the COVID-19 declaration under Section 564(b)(1) of the Act, 21 U.S.C. section 360bbb-3(b)(1), unless the authorization is terminated or revoked.  Performed at [facility], [address] [place], [place] [id number]          Radiology Studies: DG Chest 2 View  Result Date: 04/22/2021 CLINICAL DATA:  Shortness of  breath. EXAM: CHEST - 2 VIEW COMPARISON:  None. FINDINGS: Mild cardiomegaly is noted. No pneumothorax is noted. Minimal bibasilar subsegmental atelectasis or edema is noted with small bilateral pleural effusions. Bony thorax is unremarkable. IMPRESSION: Minimal bibasilar subsegmental atelectasis or edema is noted with small bilateral pleural effusions. Aortic Atherosclerosis (ICD10-I70.0). Electronically Signed   By: Lupita RaiderJames  Green Jr M.D.   On: 04/22/2021 12:32   CT Angio Chest PE W/Cm &/Or Wo Cm  Result Date: 04/22/2021 CLINICAL DATA:  Dyspnea. EXAM: CT ANGIOGRAPHY CHEST WITH CONTRAST TECHNIQUE: Multidetector CT imaging of the chest was performed using the standard protocol during bolus administration of intravenous contrast. Multiplanar CT image reconstructions and MIPs were obtained to evaluate the vascular anatomy. CONTRAST:  60mL OMNIPAQUE IOHEXOL 350 MG/ML SOLN COMPARISON:  Radiograph of same day. FINDINGS: Cardiovascular: Satisfactory opacification of the pulmonary arteries to the segmental level. No evidence of pulmonary embolism. Mild cardiomegaly is noted. Atherosclerosis of thoracic aorta is noted without aneurysm formation. Coronary artery calcifications are noted. No pericardial effusion. Mediastinum/Nodes: No enlarged mediastinal, hilar, or axillary lymph nodes. Thyroid gland, trachea, and esophagus demonstrate no significant findings. Lungs/Pleura: No pneumothorax is noted. Moderate size right pleural effusion is noted with adjacent subsegmental atelectasis of the right lower lobe. Small left pleural effusion is noted with adjacent subsegmental atelectasis of the left lower lobe. Focal left upper lobe opacity is noted peripherally consistent with subsegmental atelectasis or possibly pneumonia. Upper Abdomen: No acute abnormality. Musculoskeletal: No chest wall abnormality. No acute or significant osseous findings. Review of the MIP images confirms the above findings. IMPRESSION: No definite  evidence of pulmonary embolus. Coronary artery calcifications are noted. Bilateral pleural effusions are noted with adjacent subsegmental atelectasis, right greater than left. Small focal opacity is noted peripherally in the left upper lobe concerning for subsegmental atelectasis or possibly pneumonia. Aortic Atherosclerosis (ICD10-I70.0). Electronically Signed   By: Lupita RaiderJames  Green Jr M.D.   On: 04/22/2021 14:14   ECHOCARDIOGRAM COMPLETE  Result Date: 04/23/2021    ECHOCARDIOGRAM REPORT   Patient Name:   Krystal Hoffman Date of Exam: 04/22/2021 Medical Rec #:  [id number]   Height:       67.0 in Accession #:    [id number]  Weight:       250.0 lb Date of Birth:  07/27/1951   BSA:          2.223 m Patient Age:    69 years    BP:  132/97 mmHg Patient Gender: F           HR:           81 bpm. Exam Location:  [facility] Procedure: 2D Echo, Cardiac Doppler and Color Doppler Indications:     I50.21 Acute Systolic Heart Failure  History:         Patient has no prior history of Echocardiogram examinations.                  Risk Factors:Hypertension, Diabetes and Dyslipidemia. Anxiety.  Sonographer:     Daphine Deutscher RDCS Referring Phys:  [id number] Deno Lunger [facility] Diagnosing Phys: Alwyn Pea MD IMPRESSIONS  1. Borderline inferior Hypo. Left ventricular ejection fraction, by estimation, is 50 to 55%. The left ventricle has low normal function. The left ventricle demonstrates regional wall motion abnormalities (see scoring diagram/findings for description). Left ventricular diastolic parameters were normal.  2. Right ventricular systolic function is normal. The right ventricular size is normal.  3. The mitral valve is normal in structure. Mild mitral valve regurgitation.  4. The aortic valve is normal in structure. Aortic valve regurgitation is not visualized. FINDINGS  Left Ventricle: Borderline inferior Hypo. Left ventricular ejection fraction, by estimation, is 50 to 55%. The left ventricle has low normal  function. The left ventricle demonstrates regional wall motion abnormalities. The left ventricular internal cavity size was normal in size. There is no left ventricular hypertrophy. Left ventricular diastolic parameters were normal. Right Ventricle: The right ventricular size is normal. No increase in right ventricular wall thickness. Right ventricular systolic function is normal. Left Atrium: Left atrial size was normal in size. Right Atrium: Right atrial size was normal in size. Pericardium: There is no evidence of pericardial effusion. Mitral Valve: The mitral valve is normal in structure. Mild mitral valve regurgitation. Tricuspid Valve: The tricuspid valve is normal in structure. Tricuspid valve regurgitation is mild. Aortic Valve: The aortic valve is normal in structure. Aortic valve regurgitation is not visualized. Aortic valve mean gradient measures 9.4 mmHg. Aortic valve peak gradient measures 14.9 mmHg. Aortic valve area, by VTI measures 1.92 cm. Pulmonic Valve: The pulmonic valve was normal in structure. Pulmonic valve regurgitation is not visualized. Aorta: The ascending aorta was not well visualized. IAS/Shunts: No atrial level shunt detected by color flow Doppler.  LEFT VENTRICLE PLAX 2D LVIDd:         4.64 cm LVIDs:         3.33 cm LV PW:         1.13 cm LV IVS:        1.13 cm LVOT diam:     2.00 cm LV SV:         61 LV SV Index:   27 LVOT Area:     3.14 cm  RIGHT VENTRICLE             IVC RV Basal diam:  4.83 cm     IVC diam: 2.48 cm RV S prime:     13.40 cm/s TAPSE (M-mode): 2.6 cm LEFT ATRIUM             Index       RIGHT ATRIUM           Index LA diam:        4.70 cm 2.11 cm/m  RA Area:     18.50 cm LA Vol (A2C):   68.0 ml 30.58 ml/m RA Volume:   55.80 ml  25.10 ml/m LA Vol (A4C):  73.1 ml 32.88 ml/m LA Biplane Vol: 70.8 ml 31.84 ml/m  AORTIC VALVE AV Area (Vmax):    1.88 cm AV Area (Vmean):   1.74 cm AV Area (VTI):     1.92 cm AV Vmax:           192.80 cm/s AV Vmean:          147.000  cm/s AV VTI:            0.318 m AV Peak Grad:      14.9 mmHg AV Mean Grad:      9.4 mmHg LVOT Vmax:         115.50 cm/s LVOT Vmean:        81.300 cm/s LVOT VTI:          0.194 m LVOT/AV VTI ratio: 0.61  AORTA Ao Root diam: 2.70 cm MV E velocity: 117.00 cm/s                             SHUNTS                             Systemic VTI:  0.19 m                             Systemic Diam: 2.00 cm Dwayne D Callwood MD Electronically signed by Alwyn Pea MD Signature Date/Time: 04/23/2021/7:58:54 AM    Final         Scheduled Meds:  amiodarone  200 mg Oral Daily   amLODipine  10 mg Oral Daily   atorvastatin  10 mg Oral Daily   FLUoxetine  20 mg Oral Daily   furosemide  40 mg Intravenous BID   insulin aspart  0-15 Units Subcutaneous TID AC & HS   latanoprost  1 drop Both Eyes QHS   losartan  100 mg Oral Daily   pantoprazole  40 mg Oral Daily   Continuous Infusions:   LOS: 2 days    Time spent: 32 mins     Charise Killian, MD [facility] Pager [phone]  If 7PM-7AM, please contact night-coverage 04/24/2021, 8:33 AM

## 2021-04-24 NOTE — Progress Notes (Signed)
OT Cancellation Note  Patient Details Name: Krystal Hoffman MRN: [id number] DOB: 10-06-1950   Cancelled Treatment:    Reason Eval/Treat Not Completed: OT screened, no needs identified, will sign off Upon chart review and in speaking with patient and physical therapist; determined pt is performing ADLs at her functional baseline and aware of energy conservation strategies. No further acute OT needs. Will complete order. Thank you.  Rejeana Brock, MS, OTR/L ascom [phone] 04/24/21, 11:37 AM

## 2021-04-25 DIAGNOSIS — J810 Acute pulmonary edema: Secondary | ICD-10-CM | POA: Diagnosis not present

## 2021-04-25 DIAGNOSIS — I4819 Other persistent atrial fibrillation: Secondary | ICD-10-CM | POA: Diagnosis not present

## 2021-04-25 LAB — CBC
HCT: 35.8 % — ABNORMAL LOW (ref 36.0–46.0)
Hemoglobin: 10.6 g/dL — ABNORMAL LOW (ref 12.0–15.0)
MCH: 25.9 pg — ABNORMAL LOW (ref 26.0–34.0)
MCHC: 29.6 g/dL — ABNORMAL LOW (ref 30.0–36.0)
MCV: 87.3 fL (ref 80.0–100.0)
Platelets: 243 10*3/uL (ref 150–400)
RBC: 4.1 MIL/uL (ref 3.87–5.11)
RDW: 16.4 % — ABNORMAL HIGH (ref 11.5–15.5)
WBC: 5.7 10*3/uL (ref 4.0–10.5)
nRBC: 0.3 % — ABNORMAL HIGH (ref 0.0–0.2)

## 2021-04-25 LAB — GLUCOSE, CAPILLARY
Glucose-Capillary: 97 mg/dL (ref 70–99)
Glucose-Capillary: 171 mg/dL — ABNORMAL HIGH (ref 70–99)
Glucose-Capillary: 111 mg/dL — ABNORMAL HIGH (ref 70–99)
Glucose-Capillary: 145 mg/dL — ABNORMAL HIGH (ref 70–99)

## 2021-04-25 LAB — BASIC METABOLIC PANEL
Anion gap: 7 (ref 5–15)
BUN: 37 mg/dL — ABNORMAL HIGH (ref 8–23)
CO2: 35 mmol/L — ABNORMAL HIGH (ref 22–32)
Calcium: 8.4 mg/dL — ABNORMAL LOW (ref 8.9–10.3)
Chloride: 99 mmol/L (ref 98–111)
Creatinine, Ser: 1.7 mg/dL — ABNORMAL HIGH (ref 0.44–1.00)
GFR, Estimated: 32 mL/min — ABNORMAL LOW (ref >60)
Glucose, Bld: 96 mg/dL (ref 70–99)
Potassium: 3.8 mmol/L (ref 3.5–5.1)
Sodium: 141 mmol/L (ref 135–145)

## 2021-04-25 MED ORDER — SODIUM CHLORIDE 0.9% FLUSH
10.0000 mL | Freq: Two times a day (BID) | INTRAVENOUS | Status: DC
  Administered 2021-04-25 – 2021-04-28 (×5): 10 mL via INTRAVENOUS

## 2021-04-25 NOTE — Care Management Important Message (Signed)
Important Message  Patient Details  Name: Krystal Hoffman MRN: [id number] Date of Birth: Dec 18, 1950   Medicare Important Message Given:  Yes     Johnell Comings 04/25/2021, 1:33 PM

## 2021-04-25 NOTE — Progress Notes (Addendum)
PROGRESS NOTE    Krystal Hoffman  ZOX:[id number] DOB: [birth year] DOA: 04/22/2021 PCP: Gracelyn Nurse, MD    Assessment & Plan:   Principal Problem:   Acute on chronic congestive heart failure (HCC) Active Problems:   Type 2 diabetes mellitus with stage 3b chronic kidney disease, without long-term current use of insulin (HCC)   GERD without esophagitis   Essential hypertension   Chronic kidney disease, stage 3b (HCC)   Mixed diabetic hyperlipidemia associated with type 2 diabetes mellitus (HCC)   Persistent atrial fibrillation (HCC)  Pulmonary edema: etiology unclear. Echo shows EF 50-55%, normal diastolic function. No hx of CHF but has nocturnal dyspnea, orthopnea & mild elevation of BNP. Continue on IV lasix as per cardio.Monitor I/Os  Chronic atrial fibrillation: continue to hold eliquis secondary to RLE hematoma. Consider starting xarelto instead when stable to restart anticoagulation. Continue on amio, metoprolol. Management as per cardio    Right lower extremity hematoma: improving slowly but still w/ pain. Secondary to a bench falling on RLE. [place] of RLE was neg for DVT but positive for likely hematoma on 8/15.    DM2: well controlled w/ HbA1c 6.2. Continue on SSI w/ accuchecks   CKDIIIb: Cr is trending from day prior, possibly secondary to lasix use. Will hold home dose of losartan.    GERD : continue on PPI    HTN: continue on amio. Hold losartan     HLD: continue on statin   Morbid obesity: BMI 40.7. Complicates overall care & prognosis  DVT prophylaxis: SCDs of LLE only  Code Status: full  Family Communication:  Disposition Plan: likely d/c back home.  Level of care: Progressive Cardiac  Status is: Inpatient  Remains inpatient appropriate because:IV treatments appropriate due to intensity of illness or inability to take PO and Inpatient level of care appropriate due to severity of illness  Dispo: The patient is from: Home              Anticipated d/c is to:  Home              Patient currently is not medically stable to d/c.   Difficult to place patient : unclear       Consultants:  cardio  Procedures:   Antimicrobials:   Subjective: Pt c/o malaise and RLE pain   Objective: Vitals:   04/24/21 1651 04/24/21 1948 04/25/21 0025 04/25/21 0335  BP: 112/66 102/70 107/70 (!) 104/39  Pulse: 77 64 (!) 53 (!) 58  Resp: 17 16 16 16   Temp: 98.1 F (36.7 C) 98.4 F (36.9 C) (!) 97.5 F (36.4 C) 97.9 F (36.6 C)  TempSrc:      SpO2: (!) 81% 94% 95% 98%  Weight:    118 kg  Height:        Intake/Output Summary (Last 24 hours) at 04/25/2021 0809 Last data filed at 04/24/2021 2200 Gross per 24 hour  Intake 1080 ml  Output 1400 ml  Net -320 ml   Filed Weights   04/22/21 1132 04/24/21 0658 04/25/21 0335  Weight: 113.4 kg 118.3 kg 118 kg    Examination:  General exam: Appears calm. Morbidly obese Respiratory system: diminished breath sounds b/l Cardiovascular system: irregularly irregular. No gallops or clicks  Gastrointestinal system: Abd is soft, NT, obese, & normal bowel sounds  Central nervous system: Alert and oriented. Moves all extremities  Psychiatry: Judgement and insight appear normal. Flat mood and affect    Data Reviewed: I have personally reviewed following labs  and imaging studies  CBC: Recent Labs  Lab 04/22/21 1153 04/23/21 0630 04/24/21 0428 04/25/21 0412  WBC 6.5 5.7 6.3 5.7  NEUTROABS 5.3 4.0  --   --   HGB 12.5 11.3* 10.7* 10.6*  HCT 41.2 38.1 36.5 35.8*  MCV 87.5 87.4 86.3 87.3  PLT 315 281 266 243   Basic Metabolic Panel: Recent Labs  Lab 04/22/21 1153 04/23/21 0630 04/24/21 0428 04/25/21 0412  NA 140 145 140 141  K 4.4 4.2 4.1 3.8  CL 102 101 98 99  CO2 30 34* 32 35*  GLUCOSE 129* 84 95 96  BUN 39* 37* 35* 37*  CREATININE 1.65* 1.69* 1.84* 1.70*  CALCIUM 9.2 9.0 8.5* 8.4*  MG  --  2.2  --   --    GFR: Estimated Creatinine Clearance: 41.5 mL/min (A) (by C-G formula based on SCr  of 1.7 mg/dL (H)). Liver Function Tests: Recent Labs  Lab 04/22/21 1153 04/23/21 0630  AST 21 18  ALT 20 16  ALKPHOS 75 64  BILITOT 1.4* 1.3*  PROT 7.8 6.9  ALBUMIN 3.8 3.4*   No results for input(s): LIPASE, AMYLASE in the last 168 hours. No results for input(s): AMMONIA in the last 168 hours. Coagulation Profile: No results for input(s): INR, PROTIME in the last 168 hours. Cardiac Enzymes: No results for input(s): CKTOTAL, CKMB, CKMBINDEX, TROPONINI in the last 168 hours. BNP (last 3 results) No results for input(s): PROBNP in the last 8760 hours. HbA1C: Recent Labs    04/23/21 0630  HGBA1C 6.2*   CBG: Recent Labs  Lab 04/23/21 2031 04/24/21 0843 04/24/21 1125 04/24/21 1656 04/24/21 1953  GLUCAP 119* 118* 141* 78 190*   Lipid Profile: No results for input(s): CHOL, HDL, LDLCALC, TRIG, CHOLHDL, LDLDIRECT in the last 72 hours. Thyroid Function Tests: No results for input(s): TSH, T4TOTAL, FREET4, T3FREE, THYROIDAB in the last 72 hours. Anemia Panel: No results for input(s): VITAMINB12, FOLATE, FERRITIN, TIBC, IRON, RETICCTPCT in the last 72 hours. Sepsis Labs: Recent Labs  Lab 04/22/21 1154  PROCALCITON <0.10    Recent Results (from the past 240 hour(s))  Resp Panel by RT-PCR (Flu A&B, Covid) Nasopharyngeal Swab     Status: None   Collection Time: 04/22/21  1:25 PM   Specimen: Nasopharyngeal Swab; Nasopharyngeal(NP) swabs in vial transport medium  Result Value Ref Range Status   SARS Coronavirus 2 by RT PCR NEGATIVE NEGATIVE Final    Comment: (NOTE) SARS-CoV-2 target nucleic acids are NOT DETECTED.  The SARS-CoV-2 RNA is generally detectable in upper respiratory specimens during the acute phase of infection. The lowest concentration of SARS-CoV-2 viral copies this assay can detect is 138 copies/mL. A negative result does not preclude SARS-Cov-2 infection and should not be used as the sole basis for treatment or other patient management decisions. A  negative result may occur with  improper specimen collection/handling, submission of specimen other than nasopharyngeal swab, presence of viral mutation(s) within the areas targeted by this assay, and inadequate number of viral copies(<138 copies/mL). A negative result must be combined with clinical observations, patient history, and epidemiological information. The expected result is Negative.  Fact Sheet for Patients:  BloggerCourse.com  Fact Sheet for Healthcare Providers:  SeriousBroker.it  This test is no t yet approved or cleared by the [place] FDA and  has been authorized for detection and/or diagnosis of SARS-CoV-2 by FDA under an Emergency Use Authorization (EUA). This EUA will remain  in effect (meaning this test can be used) for  the duration of the COVID-19 declaration under Section 564(b)(1) of the Act, 21 U.S.C.section 360bbb-3(b)(1), unless the authorization is terminated  or revoked sooner.       Influenza A by PCR NEGATIVE NEGATIVE Final   Influenza B by PCR NEGATIVE NEGATIVE Final    Comment: (NOTE) The Xpert Xpress SARS-CoV-2/FLU/RSV plus assay is intended as an aid in the diagnosis of influenza from Nasopharyngeal swab specimens and should not be used as a sole basis for treatment. Nasal washings and aspirates are unacceptable for Xpert Xpress SARS-CoV-2/FLU/RSV testing.  Fact Sheet for Patients: BloggerCourse.com  Fact Sheet for Healthcare Providers: SeriousBroker.it  This test is not yet approved or cleared by the [place] FDA and has been authorized for detection and/or diagnosis of SARS-CoV-2 by FDA under an Emergency Use Authorization (EUA). This EUA will remain in effect (meaning this test can be used) for the duration of the COVID-19 declaration under Section 564(b)(1) of the Act, 21 U.S.C. section 360bbb-3(b)(1), unless the authorization  is terminated or revoked.  Performed at [facility], [address], [place], [place] [address]          Radiology Studies: No results found.      Scheduled Meds:  amiodarone  200 mg Oral Daily   atorvastatin  10 mg Oral Daily   FLUoxetine  20 mg Oral Daily   furosemide  40 mg Intravenous Daily   insulin aspart  0-15 Units Subcutaneous TID AC & HS   latanoprost  1 drop Both Eyes QHS   metoprolol tartrate  25 mg Oral BID   pantoprazole  40 mg Oral Daily   Continuous Infusions:   LOS: 3 days    Time spent: 30 mins     Charise Killian, MD [facility] Pager [phone]  If 7PM-7AM, please contact night-coverage 04/25/2021, 8:09 AM

## 2021-04-25 NOTE — Progress Notes (Signed)
[facility] Cardiology    SUBJECTIVE: Patient feels reasonably well has received pain medicines for her leg improved pain still has some swelling and ecchymosis denies chest pain or palpitations still has some dyspnea low arterial sat   Vitals:   04/24/21 1651 04/24/21 1948 04/25/21 0025 04/25/21 0335  BP: 112/66 102/70 107/70 (!) 104/39  Pulse: 77 64 (!) 53 (!) 58  Resp: 17 16 16 16   Temp: 98.1 F (36.7 C) 98.4 F (36.9 C) (!) 97.5 F (36.4 C) 97.9 F (36.6 C)  TempSrc:      SpO2: (!) 81% 94% 95% 98%  Weight:    118 kg  Height:         Intake/Output Summary (Last 24 hours) at 04/25/2021 04/27/2021 Last data filed at 04/24/2021 2200 Gross per 24 hour  Intake 1080 ml  Output 1400 ml  Net -320 ml      PHYSICAL EXAM  General: Well developed, well nourished, in no acute distress HEENT:  Normocephalic and atramatic Neck:  No JVD.  Lungs: Clear bilaterally to auscultation and percussion. Heart: HRRR . Normal S1 and S2 without gallops or murmurs.  Abdomen: Bowel sounds are positive, abdomen soft and non-tender  Msk:  Back normal, normal gait. Normal strength and tone for age. Extremities: No clubbing, cyanosis or edema.  Leg swelling ecchymosis hematoma Neuro: Alert and oriented X 3. Psych:  Good affect, responds appropriately   LABS: Basic Metabolic Panel: Recent Labs    04/23/21 0630 04/24/21 0428 04/25/21 0412  NA 145 140 141  K 4.2 4.1 3.8  CL 101 98 99  CO2 34* 32 35*  GLUCOSE 84 95 96  BUN 37* 35* 37*  CREATININE 1.69* 1.84* 1.70*  CALCIUM 9.0 8.5* 8.4*  MG 2.2  --   --    Liver Function Tests: Recent Labs    04/22/21 1153 04/23/21 0630  AST 21 18  ALT 20 16  ALKPHOS 75 64  BILITOT 1.4* 1.3*  PROT 7.8 6.9  ALBUMIN 3.8 3.4*   No results for input(s): LIPASE, AMYLASE in the last 72 hours. CBC: Recent Labs    04/22/21 1153 04/23/21 0630 04/24/21 0428 04/25/21 0412  WBC 6.5 5.7 6.3 5.7  NEUTROABS 5.3 4.0  --   --   HGB 12.5 11.3* 10.7* 10.6*  HCT 41.2  38.1 36.5 35.8*  MCV 87.5 87.4 86.3 87.3  PLT 315 281 266 243   Cardiac Enzymes: No results for input(s): CKTOTAL, CKMB, CKMBINDEX, TROPONINI in the last 72 hours. BNP: Invalid input(s): POCBNP D-Dimer: No results for input(s): DDIMER in the last 72 hours. Hemoglobin A1C: Recent Labs    04/23/21 0630  HGBA1C 6.2*   Fasting Lipid Panel: No results for input(s): CHOL, HDL, LDLCALC, TRIG, CHOLHDL, LDLDIRECT in the last 72 hours. Thyroid Function Tests: No results for input(s): TSH, T4TOTAL, T3FREE, THYROIDAB in the last 72 hours.  Invalid input(s): FREET3 Anemia Panel: No results for input(s): VITAMINB12, FOLATE, FERRITIN, TIBC, IRON, RETICCTPCT in the last 72 hours.  No results found.   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

## 2021-04-26 DIAGNOSIS — I4819 Other persistent atrial fibrillation: Secondary | ICD-10-CM | POA: Diagnosis not present

## 2021-04-26 DIAGNOSIS — J810 Acute pulmonary edema: Secondary | ICD-10-CM | POA: Diagnosis not present

## 2021-04-26 LAB — BASIC METABOLIC PANEL
Anion gap: 6 (ref 5–15)
BUN: 44 mg/dL — ABNORMAL HIGH (ref 8–23)
CO2: 33 mmol/L — ABNORMAL HIGH (ref 22–32)
Calcium: 8.7 mg/dL — ABNORMAL LOW (ref 8.9–10.3)
Chloride: 100 mmol/L (ref 98–111)
Creatinine, Ser: 1.82 mg/dL — ABNORMAL HIGH (ref 0.44–1.00)
GFR, Estimated: 30 mL/min — ABNORMAL LOW (ref >60)
Glucose, Bld: 126 mg/dL — ABNORMAL HIGH (ref 70–99)
Potassium: 4.5 mmol/L (ref 3.5–5.1)
Sodium: 139 mmol/L (ref 135–145)

## 2021-04-26 LAB — CBC
HCT: 36 % (ref 36.0–46.0)
Hemoglobin: 10.8 g/dL — ABNORMAL LOW (ref 12.0–15.0)
MCH: 26.7 pg (ref 26.0–34.0)
MCHC: 30 g/dL (ref 30.0–36.0)
MCV: 88.9 fL (ref 80.0–100.0)
Platelets: 214 10*3/uL (ref 150–400)
RBC: 4.05 MIL/uL (ref 3.87–5.11)
RDW: 16.2 % — ABNORMAL HIGH (ref 11.5–15.5)
WBC: 6 10*3/uL (ref 4.0–10.5)
nRBC: 0 % (ref 0.0–0.2)

## 2021-04-26 LAB — GLUCOSE, CAPILLARY
Glucose-Capillary: 134 mg/dL — ABNORMAL HIGH (ref 70–99)
Glucose-Capillary: 181 mg/dL — ABNORMAL HIGH (ref 70–99)
Glucose-Capillary: 134 mg/dL — ABNORMAL HIGH (ref 70–99)
Glucose-Capillary: 155 mg/dL — ABNORMAL HIGH (ref 70–99)

## 2021-04-26 MED ORDER — FUROSEMIDE 40 MG PO TABS
40.0000 mg | ORAL_TABLET | Freq: Every day | ORAL | Status: DC
  Administered 2021-04-27 – 2021-04-29 (×3): 40 mg via ORAL
  Filled 2021-04-26 (×3): qty 1

## 2021-04-26 MED ORDER — METOPROLOL TARTRATE 25 MG PO TABS
12.5000 mg | ORAL_TABLET | Freq: Two times a day (BID) | ORAL | Status: DC
  Administered 2021-04-26 – 2021-04-28 (×4): 12.5 mg via ORAL
  Filled 2021-04-26 (×4): qty 1

## 2021-04-26 NOTE — Progress Notes (Addendum)
[facility] Cardiology    SUBJECTIVE:  - Feeling better today.  - States her breathing is back to baseline.  - Leg not bothering her as much today.    Vitals:   04/26/21 0444 04/26/21 0450 04/26/21 0828 04/26/21 1119  BP: 110/60  (!) 113/48 109/71  Pulse: (!) 53  (!) 48 (!) 59  Resp: 18  17 16   Temp: 97.8 F (36.6 C)  97.8 F (36.6 C) 98.1 F (36.7 C)  TempSrc:   Oral   SpO2: (!) 76% 92% 97% 98%  Weight: 117.8 kg     Height:         Intake/Output Summary (Last 24 hours) at 04/26/2021 1205 Last data filed at 04/25/2021 1754 Gross per 24 hour  Intake 480 ml  Output --  Net 480 ml       PHYSICAL EXAM  General: Well developed, well nourished, in no acute distress HEENT:  Normocephalic and atramatic Neck:  No JVD.  Lungs: Clear bilaterally to auscultation and percussion. Heart: HRRR . Normal S1 and S2 without gallops or murmurs.  Abdomen: Bowel sounds are positive, abdomen soft and non-tender  Msk:  Back normal, normal gait. Normal strength and tone for age. Extremities: No clubbing, cyanosis or edema.  Leg swelling ecchymosis hematoma Neuro: Alert and oriented X 3. Psych:  Good affect, responds appropriately   LABS: Basic Metabolic Panel: Recent Labs    04/25/21 0412 04/26/21 0457  NA 141 139  K 3.8 4.5  CL 99 100  CO2 35* 33*  GLUCOSE 96 126*  BUN 37* 44*  CREATININE 1.70* 1.82*  CALCIUM 8.4* 8.7*    Liver Function Tests: No results for input(s): AST, ALT, ALKPHOS, BILITOT, PROT, ALBUMIN in the last 72 hours.  No results for input(s): LIPASE, AMYLASE in the last 72 hours. CBC: Recent Labs    04/25/21 0412 04/26/21 0457  WBC 5.7 6.0  HGB 10.6* 10.8*  HCT 35.8* 36.0  MCV 87.3 88.9  PLT 243 214    Cardiac Enzymes: No results for input(s): CKTOTAL, CKMB, CKMBINDEX, TROPONINI in the last 72 hours. BNP: Invalid input(s): POCBNP D-Dimer: No results for input(s): DDIMER in the last 72 hours. Hemoglobin A1C: No results for input(s): HGBA1C in the  last 72 hours.  Fasting Lipid Panel: No results for input(s): CHOL, HDL, LDLCALC, TRIG, CHOLHDL, LDLDIRECT in the last 72 hours. Thyroid Function Tests: No results for input(s): TSH, T4TOTAL, T3FREE, THYROIDAB in the last 72 hours.  Invalid input(s): FREET3 Anemia Panel: No results for input(s): VITAMINB12, FOLATE, FERRITIN, TIBC, IRON, RETICCTPCT in the last 72 hours.  No results found.   Echo borderline overall left ventricular function ejection fraction around 50%  TELEMETRY: Sinus bradycardia rate of 60:  ASSESSMENT AND PLAN:  Principal Problem:   Acute on chronic congestive heart failure (HCC) Active Problems:   Type 2 diabetes mellitus with stage 3b chronic kidney disease, without long-term current use of insulin (HCC)   GERD without esophagitis   Essential hypertension   Chronic kidney disease, stage 3b (HCC)   Mixed diabetic hyperlipidemia associated with type 2 diabetes mellitus (HCC)   Persistent atrial fibrillation (HCC)   70 year old female with history of persistent atrial fibrillation, chronic kidney disease stage III, hypertension, type 2 diabetes, who was admitted with a thigh hematoma.   Plan #Right lower extremity hematoma #Chronic atrial fibrillation Suffered a thigh hematoma when she fell from a bench and injured her right lower extremity.  This occurred on 8/15. -Continue to hold Eliquis -Continue  amiodarone and metoprolol (outpatient medications). Given bradycardia will decrease to 12.5 mg BID -Continue elevation and warm compresses.  # Shortness of breath Echocardiogram showed an EF of 50 to 55% with normal diastolic dysfunction.  Normal RV function and no valvular pathology.  She had shortness of breath on presentation and has been treated with IV Lasix daily. -Renal function is worsening with elevation of BUN as well as CO2. - Stop lasix IV today. Switch to PO lasix 40 mg tomorrow.   #Chronic medical conditions -Continue statin therapy and  diabetes management.    Armando Reichert, MD, 04/26/2021 12:05 PM

## 2021-04-26 NOTE — Progress Notes (Signed)
Call received from [facility], patient with 9 beat run vtach.  Dr. Mayford Knife and cardiology made aware. No new orders received. Patient without complaints.

## 2021-04-26 NOTE — Progress Notes (Signed)
Physical Therapy Treatment Patient Details Name: Krystal Hoffman MRN: [id number] DOB: Feb 06, 1951 Today's Date: 04/26/2021    History of Present Illness 70 yo F who comes to [facility] on 04/22/21 c SOB. Pt here on 8/15 in ED for RLE pain/swelling after injury during yardwork in the setting of eliquis use.    PMH: depression, CKD3b, DM, HLD, HTN, Rt ankle fracture. Imaging revealing of large pleural effusion, ?PNA.    PT Comments    Pt was seen with nursing to walk in room and hall and note her O2 sats are down to 87% immediately from walk on room air.  Pt further dropped to 85% but then monitor removed to take her to BR at her quick urging.  Pt is clearly hypoxic on room air to move, and before moving fluctuated in sitting and standing from 88 to 91%.  Continue to work with pt as her stay dictates.   Follow Up Recommendations  No PT follow up     Equipment Recommendations  None recommended by PT    Recommendations for Other Services       Precautions / Restrictions Precautions Precautions: Fall Precaution Comments: encourage R heel contacte with gait Restrictions Weight Bearing Restrictions: No    Mobility  Bed Mobility Overal bed mobility: Independent                  Transfers Overall transfer level: Independent Equipment used: Rolling walker (2 wheeled)                Ambulation/Gait Ambulation/Gait assistance: Modified independent (Device/Increase time) Gait Distance (Feet): 70 Feet Assistive device: Rolling walker (2 wheeled) Gait Pattern/deviations: Antalgic Gait velocity: reduced Gait velocity interpretation: <1.31 ft/sec, indicative of household ambulator General Gait Details: cannot tolerate placing R heel on the floor first   Stairs             Wheelchair Mobility    Modified Rankin (Stroke Patients Only)       Balance Overall balance assessment: Modified Independent                                          Cognition  Arousal/Alertness: Awake/alert Behavior During Therapy: WFL for tasks assessed/performed Overall Cognitive Status: Within Functional Limits for tasks assessed                                        Exercises      General Comments General comments (skin integrity, edema, etc.): gait is functional but not normal, requires walker to reduce RLE WB      Pertinent Vitals/Pain Pain Assessment: Faces Faces Pain Scale: Hurts even more Pain Location: Right lower leg 2/2/ subacute hematoma Pain Descriptors / Indicators: Grimacing;Guarding Pain Intervention(s): Limited activity within patient's tolerance;Monitored during session;Premedicated before session;Repositioned    Home Living                      Prior Function            PT Goals (current goals can now be found in the care plan section) Acute Rehab PT Goals Patient Stated Goal: RLE pain management PT Goal Formulation: With patient Time For Goal Achievement: 05/31/21 Potential to Achieve Goals: Good Progress towards PT goals: Progressing toward goals    Frequency  Min 2X/week      PT Plan Current plan remains appropriate    Co-evaluation              AM-PAC PT "6 Clicks" Mobility   Outcome Measure  Help needed turning from your back to your side while in a flat bed without using bedrails?: None Help needed moving from lying on your back to sitting on the side of a flat bed without using bedrails?: None Help needed moving to and from a bed to a chair (including a wheelchair)?: None Help needed standing up from a chair using your arms (e.g., wheelchair or bedside chair)?: None Help needed to walk in hospital room?: None Help needed climbing 3-5 steps with a railing? : None 6 Click Score: 24    End of Session Equipment Utilized During Treatment: Oxygen Activity Tolerance: Patient tolerated treatment well Patient left: in bed;with family/visitor present Nurse Communication:  Mobility status PT Visit Diagnosis: Difficulty in walking, not elsewhere classified (R26.2)     Time: 3734-2876 PT Time Calculation (min) (ACUTE ONLY): 32 min  Charges:  $Gait Training: 8-22 mins                    JALEIAH ASAY 04/26/2021, 4:27 PM  Samul Dada, PT MS Acute Rehab Dept. Number: [facility] [id number] and [facility] [phone]

## 2021-04-26 NOTE — Progress Notes (Signed)
PROGRESS NOTE    Krystal Hoffman  VWU:[id number] DOB: [birth year] DOA: 04/22/2021 PCP: Gracelyn Nurse, MD    Assessment & Plan:   Principal Problem:   Acute on chronic congestive heart failure (HCC) Active Problems:   Type 2 diabetes mellitus with stage 3b chronic kidney disease, without long-term current use of insulin (HCC)   GERD without esophagitis   Essential hypertension   Chronic kidney disease, stage 3b (HCC)   Mixed diabetic hyperlipidemia associated with type 2 diabetes mellitus (HCC)   Persistent atrial fibrillation (HCC)  Pulmonary edema: etiology unclear. Echo shows EF 50-55%, normal diastolic function. No hx of CHF but has nocturnal dyspnea, orthopnea & mild elevation of BNP. Will switch to po lasix tomorrow as per cardio   Chronic atrial fibrillation: continue to hold eliquis secondary to RLE hematoma. Consider starting xarelto instead when stable to restart anticoagulation. Continue on amiodarone and reduced dose of metoprolol secondary to bradycardia    Right lower extremity hematoma: improving slowly but still w/ pain. Secondary to a bench falling on RLE. [place] of RLE was neg for DVT but positive for likely hematoma on 8/15.    DM2: HbA1c 6.2, well controlled. Continue on SSI w/ accuchecks well controlled w/ HbA1c 6.2. Continue on SSI w/ accuchecks   CKDIIIb: Cr is trending up again today, possibly secondary lasix use. Will continue to monitor    GERD : continue on PPI    HTN: continue on amio. Hold losartan     HLD: continue on statin   Morbid obesity: BMI 40.6. Complicates overall care & prognosis   DVT prophylaxis: SCDs of LLE only  Code Status: full  Family Communication:  Disposition Plan: likely d/c back home.  Level of care: Progressive Cardiac  Status is: Inpatient  Remains inpatient appropriate because:IV treatments appropriate due to intensity of illness or inability to take PO and Inpatient level of care appropriate due to severity of  illness  Dispo: The patient is from: Home              Anticipated d/c is to: Home              Patient currently is not medically stable to d/c.   Difficult to place patient : unclear       Consultants:  cardio  Procedures:   Antimicrobials:   Subjective: Pt c/o RLE pain still but improved from day prior   Objective: Vitals:   04/25/21 1926 04/26/21 0047 04/26/21 0444 04/26/21 0450  BP: 107/62 109/71 110/60   Pulse: (!) 45 (!) 56 (!) 53   Resp: 19 18 18    Temp: 98.8 F (37.1 C) (!) 97.5 F (36.4 C) 97.8 F (36.6 C)   TempSrc: Oral     SpO2: 93% 94% (!) 76% 92%  Weight:   117.8 kg   Height:        Intake/Output Summary (Last 24 hours) at 04/26/2021 0808 Last data filed at 04/25/2021 1754 Gross per 24 hour  Intake 840 ml  Output 400 ml  Net 440 ml   Filed Weights   04/24/21 0658 04/25/21 0335 04/26/21 0444  Weight: 118.3 kg 118 kg 117.8 kg    Examination:  General exam: Appears comfortable. Morbidly obese  Respiratory system: decreased breath sounds b/l Cardiovascular system: irregularly irregular. No rubs or gallops Gastrointestinal system: Abd is soft, NT, obese & normal bowel sounds  Central nervous system: Alert and oriented. Moves all extremities  Psychiatry: Judgement and insight appear normal. Flat mood  and affect    Data Reviewed: I have personally reviewed following labs and imaging studies  CBC: Recent Labs  Lab 04/22/21 1153 04/23/21 0630 04/24/21 0428 04/25/21 0412 04/26/21 0457  WBC 6.5 5.7 6.3 5.7 6.0  NEUTROABS 5.3 4.0  --   --   --   HGB 12.5 11.3* 10.7* 10.6* 10.8*  HCT 41.2 38.1 36.5 35.8* 36.0  MCV 87.5 87.4 86.3 87.3 88.9  PLT 315 281 266 243 214   Basic Metabolic Panel: Recent Labs  Lab 04/22/21 1153 04/23/21 0630 04/24/21 0428 04/25/21 0412 04/26/21 0457  NA 140 145 140 141 139  K 4.4 4.2 4.1 3.8 4.5  CL 102 101 98 99 100  CO2 30 34* 32 35* 33*  GLUCOSE 129* 84 95 96 126*  BUN 39* 37* 35* 37* 44*   CREATININE 1.65* 1.69* 1.84* 1.70* 1.82*  CALCIUM 9.2 9.0 8.5* 8.4* 8.7*  MG  --  2.2  --   --   --    GFR: Estimated Creatinine Clearance: 38.7 mL/min (A) (by C-G formula based on SCr of 1.82 mg/dL (H)). Liver Function Tests: Recent Labs  Lab 04/22/21 1153 04/23/21 0630  AST 21 18  ALT 20 16  ALKPHOS 75 64  BILITOT 1.4* 1.3*  PROT 7.8 6.9  ALBUMIN 3.8 3.4*   No results for input(s): LIPASE, AMYLASE in the last 168 hours. No results for input(s): AMMONIA in the last 168 hours. Coagulation Profile: No results for input(s): INR, PROTIME in the last 168 hours. Cardiac Enzymes: No results for input(s): CKTOTAL, CKMB, CKMBINDEX, TROPONINI in the last 168 hours. BNP (last 3 results) No results for input(s): PROBNP in the last 8760 hours. HbA1C: No results for input(s): HGBA1C in the last 72 hours.  CBG: Recent Labs  Lab 04/24/21 1953 04/25/21 0825 04/25/21 1141 04/25/21 1644 04/25/21 2111  GLUCAP 190* 97 171* 111* 145*   Lipid Profile: No results for input(s): CHOL, HDL, LDLCALC, TRIG, CHOLHDL, LDLDIRECT in the last 72 hours. Thyroid Function Tests: No results for input(s): TSH, T4TOTAL, FREET4, T3FREE, THYROIDAB in the last 72 hours. Anemia Panel: No results for input(s): VITAMINB12, FOLATE, FERRITIN, TIBC, IRON, RETICCTPCT in the last 72 hours. Sepsis Labs: Recent Labs  Lab 04/22/21 1154  PROCALCITON <0.10    Recent Results (from the past 240 hour(s))  Resp Panel by RT-PCR (Flu A&B, Covid) Nasopharyngeal Swab     Status: None   Collection Time: 04/22/21  1:25 PM   Specimen: Nasopharyngeal Swab; Nasopharyngeal(NP) swabs in vial transport medium  Result Value Ref Range Status   SARS Coronavirus 2 by RT PCR NEGATIVE NEGATIVE Final    Comment: (NOTE) SARS-CoV-2 target nucleic acids are NOT DETECTED.  The SARS-CoV-2 RNA is generally detectable in upper respiratory specimens during the acute phase of infection. The lowest concentration of SARS-CoV-2 viral copies  this assay can detect is 138 copies/mL. A negative result does not preclude SARS-Cov-2 infection and should not be used as the sole basis for treatment or other patient management decisions. A negative result may occur with  improper specimen collection/handling, submission of specimen other than nasopharyngeal swab, presence of viral mutation(s) within the areas targeted by this assay, and inadequate number of viral copies(<138 copies/mL). A negative result must be combined with clinical observations, patient history, and epidemiological information. The expected result is Negative.  Fact Sheet for Patients:  BloggerCourse.com  Fact Sheet for Healthcare Providers:  SeriousBroker.it  This test is no t yet approved or cleared by the [place]  States FDA and  has been authorized for detection and/or diagnosis of SARS-CoV-2 by FDA under an Emergency Use Authorization (EUA). This EUA will remain  in effect (meaning this test can be used) for the duration of the COVID-19 declaration under Section 564(b)(1) of the Act, 21 U.S.C.section 360bbb-3(b)(1), unless the authorization is terminated  or revoked sooner.       Influenza A by PCR NEGATIVE NEGATIVE Final   Influenza B by PCR NEGATIVE NEGATIVE Final    Comment: (NOTE) The Xpert Xpress SARS-CoV-2/FLU/RSV plus assay is intended as an aid in the diagnosis of influenza from Nasopharyngeal swab specimens and should not be used as a sole basis for treatment. Nasal washings and aspirates are unacceptable for Xpert Xpress SARS-CoV-2/FLU/RSV testing.  Fact Sheet for Patients: BloggerCourse.com  Fact Sheet for Healthcare Providers: SeriousBroker.it  This test is not yet approved or cleared by the [place] FDA and has been authorized for detection and/or diagnosis of SARS-CoV-2 by FDA under an Emergency Use Authorization (EUA). This EUA  will remain in effect (meaning this test can be used) for the duration of the COVID-19 declaration under Section 564(b)(1) of the Act, 21 U.S.C. section 360bbb-3(b)(1), unless the authorization is terminated or revoked.  Performed at [facility], [address], [place], [place] [address]          Radiology Studies: No results found.      Scheduled Meds:  amiodarone  200 mg Oral Daily   atorvastatin  10 mg Oral Daily   FLUoxetine  20 mg Oral Daily   furosemide  40 mg Intravenous Daily   insulin aspart  0-15 Units Subcutaneous TID AC & HS   latanoprost  1 drop Both Eyes QHS   metoprolol tartrate  25 mg Oral BID   pantoprazole  40 mg Oral Daily   sodium chloride flush  10 mL Intravenous Q12H   Continuous Infusions:   LOS: 4 days    Time spent: 25 mins     Charise Killian, MD [facility] Pager [phone]  If 7PM-7AM, please contact night-coverage 04/26/2021, 8:08 AM

## 2021-04-26 NOTE — Progress Notes (Signed)
Patient noted to be running HR is the 50s while this RN at bedside, patient asymptomatic, Dr. Mayford Knife made aware, metoprolol withheld.

## 2021-04-26 NOTE — Progress Notes (Addendum)
SATURATION QUALIFICATIONS: (This note is used to comply with regulatory documentation for home oxygen)  Patient Saturations on Room Air at Rest = 91%  Patient Saturations on Room Air while Ambulating = 87%  O2 at 97% when at rest on 3L [place]

## 2021-04-27 DIAGNOSIS — J810 Acute pulmonary edema: Secondary | ICD-10-CM | POA: Diagnosis not present

## 2021-04-27 DIAGNOSIS — I4819 Other persistent atrial fibrillation: Secondary | ICD-10-CM | POA: Diagnosis not present

## 2021-04-27 LAB — CBC
HCT: 36.1 % (ref 36.0–46.0)
Hemoglobin: 10.8 g/dL — ABNORMAL LOW (ref 12.0–15.0)
MCH: 26.3 pg (ref 26.0–34.0)
MCHC: 29.9 g/dL — ABNORMAL LOW (ref 30.0–36.0)
MCV: 87.8 fL (ref 80.0–100.0)
Platelets: 195 10*3/uL (ref 150–400)
RBC: 4.11 MIL/uL (ref 3.87–5.11)
RDW: 16 % — ABNORMAL HIGH (ref 11.5–15.5)
WBC: 4.6 10*3/uL (ref 4.0–10.5)
nRBC: 0 % (ref 0.0–0.2)

## 2021-04-27 LAB — BASIC METABOLIC PANEL
Anion gap: 5 (ref 5–15)
BUN: 49 mg/dL — ABNORMAL HIGH (ref 8–23)
CO2: 35 mmol/L — ABNORMAL HIGH (ref 22–32)
Calcium: 8.8 mg/dL — ABNORMAL LOW (ref 8.9–10.3)
Chloride: 100 mmol/L (ref 98–111)
Creatinine, Ser: 1.64 mg/dL — ABNORMAL HIGH (ref 0.44–1.00)
GFR, Estimated: 34 mL/min — ABNORMAL LOW (ref >60)
Glucose, Bld: 120 mg/dL — ABNORMAL HIGH (ref 70–99)
Potassium: 4.2 mmol/L (ref 3.5–5.1)
Sodium: 140 mmol/L (ref 135–145)

## 2021-04-27 LAB — GLUCOSE, CAPILLARY
Glucose-Capillary: 105 mg/dL — ABNORMAL HIGH (ref 70–99)
Glucose-Capillary: 174 mg/dL — ABNORMAL HIGH (ref 70–99)
Glucose-Capillary: 92 mg/dL (ref 70–99)
Glucose-Capillary: 204 mg/dL — ABNORMAL HIGH (ref 70–99)

## 2021-04-27 NOTE — Progress Notes (Signed)
[facility]    SUBJECTIVE:  - NSVT on monitor yesterday, 8 beats - No acute events overnight. Feels well. Wondering about going home.    Vitals:   04/26/21 1955 04/27/21 0011 04/27/21 0617 04/27/21 0742  BP: 125/68 125/87 (!) 140/53 124/88  Pulse: 71 (!) 58 (!) 59 69  Resp: 18 18 18 17   Temp: 97.9 F (36.6 C) 98 F (36.7 C) 98.5 F (36.9 C) 98.1 F (36.7 C)  TempSrc:    Oral  SpO2: 96% 96% 99% 99%  Weight:      Height:         Intake/Output Summary (Last 24 hours) at 04/27/2021 0825 Last data filed at 04/26/2021 2155 Gross per 24 hour  Intake 370 ml  Output 800 ml  Net -430 ml       PHYSICAL EXAM  General: Well developed, well nourished, in no acute distress HEENT:  Normocephalic and atramatic Neck:  No JVD.  Lungs: Clear bilaterally to auscultation and percussion. Heart: HRRR . Normal S1 and S2 without gallops or murmurs.  Abdomen: Bowel sounds are positive, abdomen soft and non-tender  Msk:  Back normal, normal gait. Normal strength and tone for age. Extremities: No clubbing, cyanosis or edema.  Leg swelling ecchymosis hematoma Neuro: Alert and oriented X 3. Psych:  Good affect, responds appropriately   LABS: Basic Metabolic Panel: Recent Labs    04/26/21 0457 04/27/21 0537  NA 139 140  K 4.5 4.2  CL 100 100  CO2 33* 35*  GLUCOSE 126* 120*  BUN 44* 49*  CREATININE 1.82* 1.64*  CALCIUM 8.7* 8.8*    Liver Function Tests: No results for input(s): AST, ALT, ALKPHOS, BILITOT, PROT, ALBUMIN in the last 72 hours.  No results for input(s): LIPASE, AMYLASE in the last 72 hours. CBC: Recent Labs    04/26/21 0457 04/27/21 0537  WBC 6.0 4.6  HGB 10.8* 10.8*  HCT 36.0 36.1  MCV 88.9 87.8  PLT 214 195    Cardiac Enzymes: No results for input(s): CKTOTAL, CKMB, CKMBINDEX, TROPONINI in the last 72 hours. BNP: Invalid input(s): POCBNP D-Dimer: No results for input(s): DDIMER in the last 72 hours. Hemoglobin A1C: No results for input(s):  HGBA1C in the last 72 hours.  Fasting Lipid Panel: No results for input(s): CHOL, HDL, LDLCALC, TRIG, CHOLHDL, LDLDIRECT in the last 72 hours. Thyroid Function Tests: No results for input(s): TSH, T4TOTAL, T3FREE, THYROIDAB in the last 72 hours.  Invalid input(s): FREET3 Anemia Panel: No results for input(s): VITAMINB12, FOLATE, FERRITIN, TIBC, IRON, RETICCTPCT in the last 72 hours.  No results found.   Echo borderline overall left ventricular function ejection fraction around 50%  TELEMETRY: Sinus bradycardia rate of 60:  ASSESSMENT AND PLAN:  Principal Problem:   Acute on chronic congestive heart failure (HCC) Active Problems:   Type 2 diabetes mellitus with stage 3b chronic kidney disease, without long-term current use of insulin (HCC)   GERD without esophagitis   Essential hypertension   Chronic kidney disease, stage 3b (HCC)   Mixed diabetic hyperlipidemia associated with type 2 diabetes mellitus (HCC)   Persistent atrial fibrillation (HCC)   70 year old female with history of persistent atrial fibrillation, chronic kidney disease stage III, hypertension, type 2 diabetes, who was admitted with a thigh hematoma.   Plan #Right lower extremity hematoma #Chronic atrial fibrillation Suffered a thigh hematoma when she fell from a bench and injured her right lower extremity.  This occurred on 8/15. -Continue to hold Eliquis -Continue amiodarone and metoprolol (  outpatient medications). Given bradycardia, decreased to 12.5 mg BID -Continue elevation and warm compresses.  # Shortness of breath Echocardiogram showed an EF of 50 to 55% with normal diastolic dysfunction.  Normal RV function and no valvular pathology.  She had shortness of breath on presentation and has been treated with IV Lasix daily. -PO lasix 40 mg daily today -Encouraged her to ambulate today more to assess how she is feeling.  - Renal function improved slightly today.    #Chronic medical  conditions -Continue statin therapy and diabetes management.    Armando Reichert, MD, 04/27/2021 8:25 AM

## 2021-04-27 NOTE — Progress Notes (Addendum)
PROGRESS NOTE    Krystal Hoffman  NTI:[id number] DOB: September 30, 1950 DOA: 04/22/2021 PCP: Gracelyn Nurse, MD    Assessment & Plan:   Principal Problem:   Acute on chronic congestive heart failure (HCC) Active Problems:   Type 2 diabetes mellitus with stage 3b chronic kidney disease, without long-term current use of insulin (HCC)   GERD without esophagitis   Essential hypertension   Chronic kidney disease, stage 3b (HCC)   Mixed diabetic hyperlipidemia associated with type 2 diabetes mellitus (HCC)   Persistent atrial fibrillation (HCC)  Pulmonary edema: etiology unclear. Echo shows EF 50-55%, normal diastolic function. No hx of CHF but has nocturnal dyspnea, orthopnea & mild elevation of BNP.  Continue on po lasix as per cardio   Chronic atrial fibrillation: continue to hold eliquis secondary to RLE hematoma. Consider starting xarelto instead when stable to restart anticoagulation. Continue on amiodarone and reduced dose of metoprolol secondary to bradycardia    Right lower extremity hematoma: still has pain but improving. Secondary to a bench falling on RLE. [place] of RLE was neg for DVT but positive for likely hematoma on 8/15. Continue w/ elevation    DM2: HbA1c 6.2, well controlled. Continue on SSI w/ accuchecks  CKDIIIb: Cr is labile. Avoid nephrotoxic meds    GERD : continue on PPI    HTN: continue on amio, metoprolol.     HLD: continue on statin   Morbid obesity: BMI 40.6. Complicates overall care & prognosis  DVT prophylaxis: SCDs of LLE only  Code Status: full  Family Communication:  Disposition Plan: likely d/c back home.  Level of care: Progressive Cardiac  Status is: Inpatient  Remains inpatient appropriate because:IV treatments appropriate due to intensity of illness or inability to take PO and Inpatient level of care appropriate due to severity of illness  Dispo: The patient is from: Home              Anticipated d/c is to: Home              Patient currently is  not medically stable to d/c.   Difficult to place patient : unclear       Consultants:  cardio  Procedures:   Antimicrobials:   Subjective: Pt c/o malaise   Objective: Vitals:   04/26/21 1955 04/27/21 0011 04/27/21 0617 04/27/21 0742  BP: 125/68 125/87 (!) 140/53 124/88  Pulse: 71 (!) 58 (!) 59 69  Resp: 18 18 18 17   Temp: 97.9 F (36.6 C) 98 F (36.7 C) 98.5 F (36.9 C) 98.1 F (36.7 C)  TempSrc:    Oral  SpO2: 96% 96% 99% 99%  Weight:      Height:        Intake/Output Summary (Last 24 hours) at 04/27/2021 0800 Last data filed at 04/26/2021 2155 Gross per 24 hour  Intake 370 ml  Output 800 ml  Net -430 ml   Filed Weights   04/24/21 0658 04/25/21 0335 04/26/21 0444  Weight: 118.3 kg 118 kg 117.8 kg    Examination:  General exam: Appears calm. Morbidly obese  Respiratory system: diminished breath sounds b/l Cardiovascular system: S1/S2+. No rubs or clicks Gastrointestinal system: Abd is soft, NT, obese & normal bowel sounds   Central nervous system: Alert and oriented. Moves all extremities  Psychiatry: Judgement and insight appear normal. Appropriate mood and affect     Data Reviewed: I have personally reviewed following labs and imaging studies  CBC: Recent Labs  Lab 04/22/21 1153 04/23/21 0630  04/24/21 0428 04/25/21 0412 04/26/21 0457 04/27/21 0537  WBC 6.5 5.7 6.3 5.7 6.0 4.6  NEUTROABS 5.3 4.0  --   --   --   --   HGB 12.5 11.3* 10.7* 10.6* 10.8* 10.8*  HCT 41.2 38.1 36.5 35.8* 36.0 36.1  MCV 87.5 87.4 86.3 87.3 88.9 87.8  PLT 315 281 266 243 214 195   Basic Metabolic Panel: Recent Labs  Lab 04/23/21 0630 04/24/21 0428 04/25/21 0412 04/26/21 0457 04/27/21 0537  NA 145 140 141 139 140  K 4.2 4.1 3.8 4.5 4.2  CL 101 98 99 100 100  CO2 34* 32 35* 33* 35*  GLUCOSE 84 95 96 126* 120*  BUN 37* 35* 37* 44* 49*  CREATININE 1.69* 1.84* 1.70* 1.82* 1.64*  CALCIUM 9.0 8.5* 8.4* 8.7* 8.8*  MG 2.2  --   --   --   --    GFR: Estimated  Creatinine Clearance: 43 mL/min (A) (by C-G formula based on SCr of 1.64 mg/dL (H)). Liver Function Tests: Recent Labs  Lab 04/22/21 1153 04/23/21 0630  AST 21 18  ALT 20 16  ALKPHOS 75 64  BILITOT 1.4* 1.3*  PROT 7.8 6.9  ALBUMIN 3.8 3.4*   No results for input(s): LIPASE, AMYLASE in the last 168 hours. No results for input(s): AMMONIA in the last 168 hours. Coagulation Profile: No results for input(s): INR, PROTIME in the last 168 hours. Cardiac Enzymes: No results for input(s): CKTOTAL, CKMB, CKMBINDEX, TROPONINI in the last 168 hours. BNP (last 3 results) No results for input(s): PROBNP in the last 8760 hours. HbA1C: No results for input(s): HGBA1C in the last 72 hours.  CBG: Recent Labs  Lab 04/26/21 0845 04/26/21 1120 04/26/21 1700 04/26/21 2143 04/27/21 0742  GLUCAP 134* 181* 134* 155* 105*   Lipid Profile: No results for input(s): CHOL, HDL, LDLCALC, TRIG, CHOLHDL, LDLDIRECT in the last 72 hours. Thyroid Function Tests: No results for input(s): TSH, T4TOTAL, FREET4, T3FREE, THYROIDAB in the last 72 hours. Anemia Panel: No results for input(s): VITAMINB12, FOLATE, FERRITIN, TIBC, IRON, RETICCTPCT in the last 72 hours. Sepsis Labs: Recent Labs  Lab 04/22/21 1154  PROCALCITON <0.10    Recent Results (from the past 240 hour(s))  Resp Panel by RT-PCR (Flu A&B, Covid) Nasopharyngeal Swab     Status: None   Collection Time: 04/22/21  1:25 PM   Specimen: Nasopharyngeal Swab; Nasopharyngeal(NP) swabs in vial transport medium  Result Value Ref Range Status   SARS Coronavirus 2 by RT PCR NEGATIVE NEGATIVE Final    Comment: (NOTE) SARS-CoV-2 target nucleic acids are NOT DETECTED.  The SARS-CoV-2 RNA is generally detectable in upper respiratory specimens during the acute phase of infection. The lowest concentration of SARS-CoV-2 viral copies this assay can detect is 138 copies/mL. A negative result does not preclude SARS-Cov-2 infection and should not be used as  the sole basis for treatment or other patient management decisions. A negative result may occur with  improper specimen collection/handling, submission of specimen other than nasopharyngeal swab, presence of viral mutation(s) within the areas targeted by this assay, and inadequate number of viral copies(<138 copies/mL). A negative result must be combined with clinical observations, patient history, and epidemiological information. The expected result is Negative.  Fact Sheet for Patients:  BloggerCourse.com  Fact Sheet for Healthcare Providers:  SeriousBroker.it  This test is no t yet approved or cleared by the [place] FDA and  has been authorized for detection and/or diagnosis of SARS-CoV-2 by FDA under  an Emergency Use Authorization (EUA). This EUA will remain  in effect (meaning this test can be used) for the duration of the COVID-19 declaration under Section 564(b)(1) of the Act, 21 U.S.C.section 360bbb-3(b)(1), unless the authorization is terminated  or revoked sooner.       Influenza A by PCR NEGATIVE NEGATIVE Final   Influenza B by PCR NEGATIVE NEGATIVE Final    Comment: (NOTE) The Xpert Xpress SARS-CoV-2/FLU/RSV plus assay is intended as an aid in the diagnosis of influenza from Nasopharyngeal swab specimens and should not be used as a sole basis for treatment. Nasal washings and aspirates are unacceptable for Xpert Xpress SARS-CoV-2/FLU/RSV testing.  Fact Sheet for Patients: BloggerCourse.com  Fact Sheet for Healthcare Providers: SeriousBroker.it  This test is not yet approved or cleared by the [place] FDA and has been authorized for detection and/or diagnosis of SARS-CoV-2 by FDA under an Emergency Use Authorization (EUA). This EUA will remain in effect (meaning this test can be used) for the duration of the COVID-19 declaration under Section 564(b)(1) of  the Act, 21 U.S.C. section 360bbb-3(b)(1), unless the authorization is terminated or revoked.  Performed at [facility], [address], [place], [place] [address]          Radiology Studies: No results found.      Scheduled Meds:  amiodarone  200 mg Oral Daily   atorvastatin  10 mg Oral Daily   FLUoxetine  20 mg Oral Daily   furosemide  40 mg Oral Daily   insulin aspart  0-15 Units Subcutaneous TID AC & HS   latanoprost  1 drop Both Eyes QHS   metoprolol tartrate  12.5 mg Oral BID   pantoprazole  40 mg Oral Daily   sodium chloride flush  10 mL Intravenous Q12H   Continuous Infusions:   LOS: 5 days    Time spent: 20 mins     Charise Killian, MD [facility] Pager [phone]  If 7PM-7AM, please contact night-coverage 04/27/2021, 8:00 AM

## 2021-04-28 ENCOUNTER — Inpatient Hospital Stay: Payer: Medicare PPO

## 2021-04-28 DIAGNOSIS — J9601 Acute respiratory failure with hypoxia: Secondary | ICD-10-CM

## 2021-04-28 LAB — CBC
HCT: 37.1 % (ref 36.0–46.0)
Hemoglobin: 10.8 g/dL — ABNORMAL LOW (ref 12.0–15.0)
MCH: 26 pg (ref 26.0–34.0)
MCHC: 29.1 g/dL — ABNORMAL LOW (ref 30.0–36.0)
MCV: 89.2 fL (ref 80.0–100.0)
Platelets: 177 10*3/uL (ref 150–400)
RBC: 4.16 MIL/uL (ref 3.87–5.11)
RDW: 16.2 % — ABNORMAL HIGH (ref 11.5–15.5)
WBC: 4.4 10*3/uL (ref 4.0–10.5)
nRBC: 0 % (ref 0.0–0.2)

## 2021-04-28 LAB — GLUCOSE, CAPILLARY
Glucose-Capillary: 130 mg/dL — ABNORMAL HIGH (ref 70–99)
Glucose-Capillary: 167 mg/dL — ABNORMAL HIGH (ref 70–99)
Glucose-Capillary: 178 mg/dL — ABNORMAL HIGH (ref 70–99)
Glucose-Capillary: 169 mg/dL — ABNORMAL HIGH (ref 70–99)

## 2021-04-28 LAB — BASIC METABOLIC PANEL
Anion gap: 4 — ABNORMAL LOW (ref 5–15)
BUN: 40 mg/dL — ABNORMAL HIGH (ref 8–23)
CO2: 36 mmol/L — ABNORMAL HIGH (ref 22–32)
Calcium: 8.7 mg/dL — ABNORMAL LOW (ref 8.9–10.3)
Chloride: 100 mmol/L (ref 98–111)
Creatinine, Ser: 1.56 mg/dL — ABNORMAL HIGH (ref 0.44–1.00)
GFR, Estimated: 36 mL/min — ABNORMAL LOW (ref >60)
Glucose, Bld: 98 mg/dL (ref 70–99)
Potassium: 4.7 mmol/L (ref 3.5–5.1)
Sodium: 140 mmol/L (ref 135–145)

## 2021-04-28 MED ORDER — METOPROLOL SUCCINATE ER 25 MG PO TB24: 12.5000 mg | ORAL_TABLET | Freq: Every day | ORAL | Status: DC

## 2021-04-28 MED ORDER — METOPROLOL SUCCINATE ER 25 MG PO TB24
12.5000 mg | ORAL_TABLET | Freq: Every day | ORAL | Status: DC
  Administered 2021-04-29: 12.5 mg via ORAL
  Filled 2021-04-28: qty 1

## 2021-04-28 NOTE — Care Management Important Message (Signed)
Important Message  Patient Details  Name: Krystal Hoffman MRN: [id number] Date of Birth: July 05, 1951   Medicare Important Message Given:  Yes     Johnell Comings 04/28/2021, 2:29 PM

## 2021-04-28 NOTE — Progress Notes (Signed)
[facility]    SUBJECTIVE:  - Off oxygen this morning. No acute events.  - Leg isn't bothering her as much.    Vitals:   04/28/21 0015 04/28/21 0338 04/28/21 0503 04/28/21 0820  BP: (!) 103/59 131/76  135/77  Pulse: 67 66  64  Resp: 18 16  17   Temp: 98.9 F (37.2 C) 97.9 F (36.6 C)  (!) 95.9 F (35.5 C)  TempSrc: Oral Oral    SpO2: 98% 99%  91%  Weight:   111.9 kg   Height:         Intake/Output Summary (Last 24 hours) at 04/28/2021 04/30/2021 Last data filed at 04/28/2021 0503 Gross per 24 hour  Intake 960 ml  Output 1600 ml  Net -640 ml       PHYSICAL EXAM  General: Well developed, well nourished, in no acute distress HEENT:  Normocephalic and atramatic Neck:  No JVD.  Lungs: Decreased breath sounds in BL bases. Heart: HRRR . Normal S1 and S2 without gallops or murmurs.  Abdomen: Bowel sounds are positive, abdomen soft and non-tender  Msk:  Back normal, normal gait. Normal strength and tone for age. Extremities: No clubbing, cyanosis or edema.  Leg swelling ecchymosis hematoma Neuro: Alert and oriented X 3. Psych:  Good affect, responds appropriately   LABS: Basic Metabolic Panel: Recent Labs    04/27/21 0537 04/28/21 0454  NA 140 140  K 4.2 4.7  CL 100 100  CO2 35* 36*  GLUCOSE 120* 98  BUN 49* 40*  CREATININE 1.64* 1.56*  CALCIUM 8.8* 8.7*    Liver Function Tests: No results for input(s): AST, ALT, ALKPHOS, BILITOT, PROT, ALBUMIN in the last 72 hours.  No results for input(s): LIPASE, AMYLASE in the last 72 hours. CBC: Recent Labs    04/27/21 0537 04/28/21 0454  WBC 4.6 4.4  HGB 10.8* 10.8*  HCT 36.1 37.1  MCV 87.8 89.2  PLT 195 177    Cardiac Enzymes: No results for input(s): CKTOTAL, CKMB, CKMBINDEX, TROPONINI in the last 72 hours. BNP: Invalid input(s): POCBNP D-Dimer: No results for input(s): DDIMER in the last 72 hours. Hemoglobin A1C: No results for input(s): HGBA1C in the last 72 hours.  Fasting Lipid Panel: No results  for input(s): CHOL, HDL, LDLCALC, TRIG, CHOLHDL, LDLDIRECT in the last 72 hours. Thyroid Function Tests: No results for input(s): TSH, T4TOTAL, T3FREE, THYROIDAB in the last 72 hours.  Invalid input(s): FREET3 Anemia Panel: No results for input(s): VITAMINB12, FOLATE, FERRITIN, TIBC, IRON, RETICCTPCT in the last 72 hours.  No results found.   Echo borderline overall left ventricular function ejection fraction around 50%  TELEMETRY: Sinus bradycardia rate of 60:  ASSESSMENT AND PLAN:  Principal Problem:   Acute on chronic congestive heart failure (HCC) Active Problems:   Type 2 diabetes mellitus with stage 3b chronic kidney disease, without long-term current use of insulin (HCC)   GERD without esophagitis   Essential hypertension   Chronic kidney disease, stage 3b (HCC)   Mixed diabetic hyperlipidemia associated with type 2 diabetes mellitus (HCC)   Persistent atrial fibrillation (HCC)   70 year old female with history of persistent atrial fibrillation, chronic kidney disease stage III, hypertension, type 2 diabetes, who was admitted with a thigh hematoma.   Plan #Right lower extremity hematoma #Chronic atrial fibrillation Suffered a thigh hematoma when she fell from a bench and injured her right lower extremity.  This occurred on 8/15. -Continue to hold Eliquis -Continue amiodarone and metoprolol (outpatient medications). Decrease metoprolol to  XL 12.5 mg daily.  -Continue elevation and warm compresses. - Follow up with Gwen Pounds in 1-2 weeks.   # Shortness of breath Echocardiogram showed an EF of 50 to 55% with normal diastolic dysfunction.  Normal RV function and no valvular pathology.  She had shortness of breath on presentation and has been treated with IV Lasix daily. -PO lasix 40 mg daily  -Encouraged her to ambulate today more to assess how she is feeling.  - Would delay stress test which is scheduled for this week.   #Chronic medical conditions -Continue statin  therapy and diabetes management.    Armando Reichert, MD, 04/28/2021 8:24 AM

## 2021-04-28 NOTE — Progress Notes (Signed)
PROGRESS NOTE    Krystal Hoffman  TFT:[id number] DOB: 1950/12/29 DOA: 04/22/2021 PCP: Gracelyn Nurse, MD    Assessment & Plan:   Principal Problem:   Acute on chronic congestive heart failure (HCC) Active Problems:   Type 2 diabetes mellitus with stage 3b chronic kidney disease, without long-term current use of insulin (HCC)   GERD without esophagitis   Essential hypertension   Chronic kidney disease, stage 3b (HCC)   Mixed diabetic hyperlipidemia associated with type 2 diabetes mellitus (HCC)   Persistent atrial fibrillation (HCC)  Pulmonary edema: etiology unclear. Echo shows EF 50-55%, normal diastolic function. No hx of CHF but has nocturnal dyspnea, orthopnea & mild elevation of BNP.  Continue on po lasix as per cardio   Acute hypoxic respiratory failure: etiology unclear. Desats w/ ambulating but recovers well with rest, asymptomatic. Repeat CXR shows increased bibasilar atelectasis & pleural effusions. Continue on lasix. Will repeat oxygen desaturation test tomorrow  Chronic atrial fibrillation: continue to hold eliquis secondary to RLE hematoma. Consider starting xarelto instead when stable to restart anticoagulation. Continue on amiodarone and reduced dose of metoprolol secondary to bradycardia    Right lower extremity hematoma: still has pain but improving. Secondary to a bench falling on RLE. [place] of RLE was neg for DVT but positive for likely hematoma on 8/15. Continue w/ elevation    DM2: well controlled, HbA1c 6.2. Continue on SSI w/ accuchecks   CKDIIIb: Cr is trending down from day prior. Avoid nephrotoxic meds    GERD : continue on PPI    HTN: continue on BB    HLD: continue on statin   Morbid obesity: BMI 40.6. Complicates overall care & prognosis   DVT prophylaxis: SCDs of LLE only  Code Status: full  Family Communication:  Disposition Plan: likely d/c back home.  Level of care: Progressive Cardiac  Status is: Inpatient  Remains inpatient appropriate  because:IV treatments appropriate due to intensity of illness or inability to take PO and Inpatient level of care appropriate due to severity of illness, desats w/ ambulation.   Dispo: The patient is from: Home              Anticipated d/c is to: Home              Patient currently is not medically stable to d/c.   Difficult to place patient : unclear       Consultants:  cardio  Procedures:   Antimicrobials:   Subjective: Pt c/o RLE pain   Objective: Vitals:   04/27/21 2151 04/28/21 0015 04/28/21 0338 04/28/21 0503  BP:  (!) 103/59 131/76   Pulse: 70 67 66   Resp:  18 16   Temp:  98.9 F (37.2 C) 97.9 F (36.6 C)   TempSrc:  Oral Oral   SpO2:  98% 99%   Weight:    111.9 kg  Height:        Intake/Output Summary (Last 24 hours) at 04/28/2021 0759 Last data filed at 04/28/2021 0503 Gross per 24 hour  Intake 960 ml  Output 1600 ml  Net -640 ml   Filed Weights   04/25/21 0335 04/26/21 0444 04/28/21 0503  Weight: 118 kg 117.8 kg 111.9 kg    Examination:  General exam: Appears comfortable  Respiratory system: decreased breath sounds b/l  Cardiovascular system: S1 & S2. No rubs or gallops Gastrointestinal system: Abd is soft, NT,obese, normal bowel sounds  Central nervous system: Alert and oriented. Moves all extremities  Psychiatry:  Judgement and insight appear normal. Appropriate mood and affect     Data Reviewed: I have personally reviewed following labs and imaging studies  CBC: Recent Labs  Lab 04/22/21 1153 04/23/21 0630 04/24/21 0428 04/25/21 0412 04/26/21 0457 04/27/21 0537 04/28/21 0454  WBC 6.5 5.7 6.3 5.7 6.0 4.6 4.4  NEUTROABS 5.3 4.0  --   --   --   --   --   HGB 12.5 11.3* 10.7* 10.6* 10.8* 10.8* 10.8*  HCT 41.2 38.1 36.5 35.8* 36.0 36.1 37.1  MCV 87.5 87.4 86.3 87.3 88.9 87.8 89.2  PLT 315 281 266 243 214 195 177   Basic Metabolic Panel: Recent Labs  Lab 04/23/21 0630 04/24/21 0428 04/25/21 0412 04/26/21 0457 04/27/21 0537  04/28/21 0454  NA 145 140 141 139 140 140  K 4.2 4.1 3.8 4.5 4.2 4.7  CL 101 98 99 100 100 100  CO2 34* 32 35* 33* 35* 36*  GLUCOSE 84 95 96 126* 120* 98  BUN 37* 35* 37* 44* 49* 40*  CREATININE 1.69* 1.84* 1.70* 1.82* 1.64* 1.56*  CALCIUM 9.0 8.5* 8.4* 8.7* 8.8* 8.7*  MG 2.2  --   --   --   --   --    GFR: Estimated Creatinine Clearance: 43.9 mL/min (A) (by C-G formula based on SCr of 1.56 mg/dL (H)). Liver Function Tests: Recent Labs  Lab 04/22/21 1153 04/23/21 0630  AST 21 18  ALT 20 16  ALKPHOS 75 64  BILITOT 1.4* 1.3*  PROT 7.8 6.9  ALBUMIN 3.8 3.4*   No results for input(s): LIPASE, AMYLASE in the last 168 hours. No results for input(s): AMMONIA in the last 168 hours. Coagulation Profile: No results for input(s): INR, PROTIME in the last 168 hours. Cardiac Enzymes: No results for input(s): CKTOTAL, CKMB, CKMBINDEX, TROPONINI in the last 168 hours. BNP (last 3 results) No results for input(s): PROBNP in the last 8760 hours. HbA1C: No results for input(s): HGBA1C in the last 72 hours.  CBG: Recent Labs  Lab 04/26/21 2143 04/27/21 0742 04/27/21 1137 04/27/21 1553 04/27/21 2145  GLUCAP 155* 105* 174* 92 204*   Lipid Profile: No results for input(s): CHOL, HDL, LDLCALC, TRIG, CHOLHDL, LDLDIRECT in the last 72 hours. Thyroid Function Tests: No results for input(s): TSH, T4TOTAL, FREET4, T3FREE, THYROIDAB in the last 72 hours. Anemia Panel: No results for input(s): VITAMINB12, FOLATE, FERRITIN, TIBC, IRON, RETICCTPCT in the last 72 hours. Sepsis Labs: Recent Labs  Lab 04/22/21 1154  PROCALCITON <0.10    Recent Results (from the past 240 hour(s))  Resp Panel by RT-PCR (Flu A&B, Covid) Nasopharyngeal Swab     Status: None   Collection Time: 04/22/21  1:25 PM   Specimen: Nasopharyngeal Swab; Nasopharyngeal(NP) swabs in vial transport medium  Result Value Ref Range Status   SARS Coronavirus 2 by RT PCR NEGATIVE NEGATIVE Final    Comment: (NOTE) SARS-CoV-2  target nucleic acids are NOT DETECTED.  The SARS-CoV-2 RNA is generally detectable in upper respiratory specimens during the acute phase of infection. The lowest concentration of SARS-CoV-2 viral copies this assay can detect is 138 copies/mL. A negative result does not preclude SARS-Cov-2 infection and should not be used as the sole basis for treatment or other patient management decisions. A negative result may occur with  improper specimen collection/handling, submission of specimen other than nasopharyngeal swab, presence of viral mutation(s) within the areas targeted by this assay, and inadequate number of viral copies(<138 copies/mL). A negative result must be combined  with clinical observations, patient history, and epidemiological information. The expected result is Negative.  Fact Sheet for Patients:  BloggerCourse.com  Fact Sheet for Healthcare Providers:  SeriousBroker.it  This test is no t yet approved or cleared by the [place] FDA and  has been authorized for detection and/or diagnosis of SARS-CoV-2 by FDA under an Emergency Use Authorization (EUA). This EUA will remain  in effect (meaning this test can be used) for the duration of the COVID-19 declaration under Section 564(b)(1) of the Act, 21 U.S.C.section 360bbb-3(b)(1), unless the authorization is terminated  or revoked sooner.       Influenza A by PCR NEGATIVE NEGATIVE Final   Influenza B by PCR NEGATIVE NEGATIVE Final    Comment: (NOTE) The Xpert Xpress SARS-CoV-2/FLU/RSV plus assay is intended as an aid in the diagnosis of influenza from Nasopharyngeal swab specimens and should not be used as a sole basis for treatment. Nasal washings and aspirates are unacceptable for Xpert Xpress SARS-CoV-2/FLU/RSV testing.  Fact Sheet for Patients: BloggerCourse.com  Fact Sheet for Healthcare  Providers: SeriousBroker.it  This test is not yet approved or cleared by the [place] FDA and has been authorized for detection and/or diagnosis of SARS-CoV-2 by FDA under an Emergency Use Authorization (EUA). This EUA will remain in effect (meaning this test can be used) for the duration of the COVID-19 declaration under Section 564(b)(1) of the Act, 21 U.S.C. section 360bbb-3(b)(1), unless the authorization is terminated or revoked.  Performed at [facility], [address] [place], [place] [address]          Radiology Studies: No results found.      Scheduled Meds:  amiodarone  200 mg Oral Daily   atorvastatin  10 mg Oral Daily   FLUoxetine  20 mg Oral Daily   furosemide  40 mg Oral Daily   insulin aspart  0-15 Units Subcutaneous TID AC & HS   latanoprost  1 drop Both Eyes QHS   metoprolol tartrate  12.5 mg Oral BID   pantoprazole  40 mg Oral Daily   sodium chloride flush  10 mL Intravenous Q12H   Continuous Infusions:   LOS: 6 days    Time spent: 15 mins     Charise Killian, MD [facility] Pager [phone]  If 7PM-7AM, please contact night-coverage 04/28/2021, 7:59 AM

## 2021-04-29 LAB — CBC
HCT: 35.6 % — ABNORMAL LOW (ref 36.0–46.0)
Hemoglobin: 10.9 g/dL — ABNORMAL LOW (ref 12.0–15.0)
MCH: 26.3 pg (ref 26.0–34.0)
MCHC: 30.6 g/dL (ref 30.0–36.0)
MCV: 86 fL (ref 80.0–100.0)
Platelets: 177 10*3/uL (ref 150–400)
RBC: 4.14 MIL/uL (ref 3.87–5.11)
RDW: 16 % — ABNORMAL HIGH (ref 11.5–15.5)
WBC: 4.6 10*3/uL (ref 4.0–10.5)
nRBC: 0 % (ref 0.0–0.2)

## 2021-04-29 LAB — GLUCOSE, CAPILLARY
Glucose-Capillary: 197 mg/dL — ABNORMAL HIGH (ref 70–99)
Glucose-Capillary: 222 mg/dL — ABNORMAL HIGH (ref 70–99)
Glucose-Capillary: 285 mg/dL — ABNORMAL HIGH (ref 70–99)

## 2021-04-29 LAB — BASIC METABOLIC PANEL
Anion gap: 8 (ref 5–15)
BUN: 33 mg/dL — ABNORMAL HIGH (ref 8–23)
CO2: 34 mmol/L — ABNORMAL HIGH (ref 22–32)
Calcium: 8.9 mg/dL (ref 8.9–10.3)
Chloride: 98 mmol/L (ref 98–111)
Creatinine, Ser: 1.35 mg/dL — ABNORMAL HIGH (ref 0.44–1.00)
GFR, Estimated: 43 mL/min — ABNORMAL LOW (ref >60)
Glucose, Bld: 146 mg/dL — ABNORMAL HIGH (ref 70–99)
Potassium: 3.9 mmol/L (ref 3.5–5.1)
Sodium: 140 mmol/L (ref 135–145)

## 2021-04-29 MED ORDER — FUROSEMIDE 40 MG PO TABS: 40.0000 mg | ORAL_TABLET | Freq: Every day | ORAL | 0 refills | Status: AC

## 2021-04-29 MED ORDER — APIXABAN 5 MG PO TABS: 5.0000 mg | ORAL_TABLET | Freq: Two times a day (BID) | ORAL | Status: AC

## 2021-04-29 MED ORDER — OXYCODONE-ACETAMINOPHEN 5-325 MG PO TABS: 1.0000 | ORAL_TABLET | Freq: Four times a day (QID) | ORAL | 0 refills | Status: AC | PRN

## 2021-04-29 MED ORDER — METOPROLOL SUCCINATE ER 25 MG PO TB24: 12.5000 mg | ORAL_TABLET | Freq: Every day | ORAL | 0 refills | Status: AC

## 2021-04-29 NOTE — Plan of Care (Signed)
  Problem: Clinical Measurements: Goal: Respiratory complications will improve Outcome: Not Progressing Note: Patient desats overnight while sleeping. Pulse takes some time to recover while awake and patient is placed in a chair position. Pulse ox reaches 90% at rest.

## 2021-04-29 NOTE — TOC Progression Note (Signed)
Transition of Care [facility]) - Progression Note    Patient Details  Name: Krystal Hoffman MRN: [id number] Date of Birth: [birth year]  [facility] CM/SW Contact  Maree Krabbe, LCSW Phone Number: 04/29/2021, 12:27 PM  Clinical Narrative:   02 ordered through [employer].         Expected Discharge Plan and Services                                                 Social Determinants of Health (SDOH) Interventions    Readmission Risk Interventions No flowsheet data found.

## 2021-04-29 NOTE — Discharge Summary (Signed)
Physician Discharge Summary  Krystal Hoffman ZOX:[id number] DOB: [birth year] DOA: 04/22/2021  PCP: Gracelyn Nurse, MD  Admit date: 04/22/2021 Discharge date: 04/29/2021  Admitted From: home  Disposition:  home   Recommendations for Outpatient Follow-up:  Follow up with PCP in 1 week F/u w/ cardio, Dr. Gwen Pounds, in 1-2 weeks  Home Health:  Equipment/Devices: 2L [place]  Discharge Condition: stable  CODE STATUS: full  Diet recommendation: Heart Healthy / Carb Modified   Brief/Interim Summary:   HPI was taken from Dr. Leafy Half: 70 year old female with multiple history of chronic kidney disease stage IIIb, hypertension, hyperlipidemia, depression, gastroesophageal reflux disease and recent diagnosis of atrial fibrillation who presents to [facility] emergency department from her primary care provider's office for hypoxia and shortness of breath.  Patient explains that in early July she started to develop mild shortness of breath.  After seeing her primary care provider he was identified to be in atrial fibrillation and was referred to cardiology for evaluation.  Patient was being placed on amiodarone and Eliquis after which she states that her shortness of breath had completely resolved.   Patient states that in early August she was attempting to move a branch when it had tipped over and landed on her right leg.  In the days that followed, patient developed severe redness swelling and pain of the right lower extremity.  Patient describes this pain as a tightness in quality, moderate to severe in severity, worse with weightbearing or movement of the affected extremity.  Patient eventually presented to [facility] emergency department on 8/15 for evaluation.  Patient was felt to be suffering from a large hematoma of the right leg secondary to the recent trauma in the setting of her use of Eliquis.  Case was discussed with her cardiologist Dr. Crissie Sickles who recommended temporarily holding the Eliquis.   Patient explains  she did hold her Eliquis for several days resulting in substantial improvement in the swelling of her right lower extremity.  Imaging is not followed however, patient noticed that she was becoming progressively more short of breath.  Shortness of breath at this time was more severe than the last, moderate to severe in intensity.  Shortness of breath is worse with exertion and improved with rest.  Patient denies any associated chest pain.  Patient reports occasional episodes of paroxysmal nocturnal dyspnea in the days that followed as well as a questionable history of orthopnea.    Patient was to follow-up with her primary care provider today 8/23 for evaluation however upon arrival patient was noted to be significantly short of breath and hypoxic with oxygen saturations still in the upper 80s after being placed on 5 L of oxygen via nasal cannula.  Her primary care provider was concerned about a pulmonary embolism considering her recent discontinuation of Eliquis and therefore sent the patient to [facility] emergency department for evaluation.   Upon evaluation in the emergency department, CT angiogram of the chest was performed and was found to be negative for pulmonary embolism but did reveal bilateral pleural effusion.  COVID-19 testing was found to be negative.  There was question of a left upper lobe infiltrate on CT angiogram of the chest that was concerning for pneumonia but procalcitonin was obtained and was found to be unremarkable.  Patient was felt to be suffering from acute congestive heart failure and was administered a dose of 40 mg intravenous Lasix.  The hospitalist group was then called to assess the patient for admission to the hospital.  Hospital course from Dr. Mayford Knife 8/24-8/30/22:  Pt presented with shortness of breath of unclear etiology, possibly secondary to pulmonary edema. CXR shows bibasilar atelectasis or edema and small b/l pleural effusions. Pt was treated w/ IV lasix. Echo showed  EF 50-55%, normal diastolic function. Pt required supplemental oxygen and pt was d/c w/ supplemental oxygen as pt could not be weaned off.  Furthermore, pt was treated for chronic a. fib w/ amio and reduced dose of metoprolol secondary to bradycardia. Home dose of eliquis was held secondary to pt's RLE hematoma.  [place] of RLE was neg for DVT but positive for likely hematoma on 8/15. Pt will f/u outpatient w/ cardio, Dr. Gwen Pounds, in 1-2 weeks. Pt verbalized her understanding. For more information, please see previous progress/consult notes.   Discharge Diagnoses:  Principal Problem:   Acute on chronic congestive heart failure (HCC) Active Problems:   Type 2 diabetes mellitus with stage 3b chronic kidney disease, without long-term current use of insulin (HCC)   GERD without esophagitis   Essential hypertension   Chronic kidney disease, stage 3b (HCC)   Mixed diabetic hyperlipidemia associated with type 2 diabetes mellitus (HCC)   Persistent atrial fibrillation (HCC)    Pulmonary edema: etiology unclear. Echo shows EF 50-55%, normal diastolic function. No hx of CHF but has nocturnal dyspnea, orthopnea & mild elevation of BNP.  Continue on po lasix as per cardio    Acute hypoxic respiratory failure: etiology unclear. Desats w/ ambulating but recovers well with rest, asymptomatic. Repeat CXR shows increased bibasilar atelectasis & pleural effusions. Continue on lasix. Will repeat oxygen desaturation test tomorrow   Chronic atrial fibrillation: continue to hold eliquis secondary to RLE hematoma. Consider starting xarelto instead when stable to restart anticoagulation. Continue on amiodarone and reduced dose of metoprolol secondary to bradycardia    Right lower extremity hematoma: still has pain but improving. Secondary to a bench falling on RLE. [place] of RLE was neg for DVT but positive for likely hematoma on 8/15. Continue w/ elevation    DM2: well controlled, HbA1c 6.2. Continue on SSI w/ accuchecks     CKDIIIb: Cr is trending down from day prior. Avoid nephrotoxic meds    GERD : continue on PPI    HTN: continue on BB    HLD: continue on statin    Morbid obesity: BMI 40.6. Complicates overall care & prognosis   Discharge Instructions  Discharge Instructions     Diet - low sodium heart healthy   Complete by: As directed    Diet Carb Modified   Complete by: As directed    Discharge instructions   Complete by: As directed    F/U w/ PCP in 1 week. F/u w/ cardio, Dr. Gwen Pounds, in 1-2 weeks   Increase activity slowly   Complete by: As directed       Allergies as of 04/29/2021       Reactions   Metformin Diarrhea   Metformin And Related Diarrhea        Medication List     STOP taking these medications    amLODipine 10 MG tablet Commonly known as: NORVASC   hydrochlorothiazide 12.5 MG capsule Commonly known as: MICROZIDE   losartan 100 MG tablet Commonly known as: COZAAR       TAKE these medications    amiodarone 200 MG tablet Commonly known as: PACERONE Take 200 mg by mouth daily.   apixaban 5 MG Tabs tablet Commonly known as: ELIQUIS Take 1 tablet (5 mg total) by  mouth 2 (two) times daily. HOLD THIS MEDICATION UNTIL YOU SEE YOUR CARDIOLOGIST IN HIS OFFICE What changed: additional instructions   aspirin 325 MG tablet Take 1 tablet (325 mg total) by mouth 2 (two) times daily.   atorvastatin 10 MG tablet Commonly known as: LIPITOR Take 10 mg by mouth daily.   FLUoxetine 20 MG capsule Commonly known as: PROZAC Take 20 mg by mouth daily.   furosemide 40 MG tablet Commonly known as: LASIX Take 1 tablet (40 mg total) by mouth daily. Start taking on: April 30, 2021   glimepiride 4 MG tablet Commonly known as: AMARYL Take 2 mg by mouth 2 (two) times daily before a meal.   latanoprost 0.005 % ophthalmic solution Commonly known as: XALATAN Place 1 drop into both eyes at bedtime.   metoprolol succinate 25 MG 24 hr tablet Commonly known as:  TOPROL-XL Take 0.5 tablets (12.5 mg total) by mouth daily. Start taking on: April 30, 2021   multivitamin capsule Take 1 capsule by mouth daily.   omeprazole 20 MG capsule Commonly known as: PRILOSEC Take 20 mg by mouth daily.   oxyCODONE-acetaminophen 5-325 MG tablet Commonly known as: PERCOCET/ROXICET Take 1 tablet by mouth every 6 (six) hours as needed for up to 5 days for moderate pain or severe pain.   sitaGLIPtin 100 MG tablet Commonly known as: JANUVIA Take 100 mg by mouth daily.   Vitamin D (Ergocalciferol) 1.25 MG (50000 UNIT) Caps capsule Commonly known as: DRISDOL Take 1 capsule (50,000 Units total) by mouth every 7 (seven) days. What changed: when to take this               Durable Medical Equipment  (From admission, onward)           Start     Ordered   04/29/21 0831  For home use only DME oxygen  Once       Question Answer Comment  Length of Need 12 Months   Mode or (Route) Nasal cannula   Liters per Minute 2   Frequency Continuous (stationary and portable oxygen unit needed)   Oxygen conserving device Yes   Oxygen delivery system Gas      04/29/21 0830            Allergies  Allergen Reactions   Metformin Diarrhea   Metformin And Related Diarrhea    Consultations: cardio   Procedures/Studies: DG Chest 2 View  Result Date: 04/28/2021 CLINICAL DATA:  Hypoxemia. EXAM: CHEST - 2 VIEW COMPARISON:  April 22, 2021. FINDINGS: Stable cardiomegaly. No pneumothorax is noted. Increased bibasilar atelectasis and bilateral pleural effusions are noted. Bony thorax is unremarkable. IMPRESSION: Increased bibasilar atelectasis and pleural effusions are noted. Electronically Signed   By: Lupita Raider M.D.   On: 04/28/2021 12:59   DG Chest 2 View  Result Date: 04/22/2021 CLINICAL DATA:  Shortness of breath. EXAM: CHEST - 2 VIEW COMPARISON:  None. FINDINGS: Mild cardiomegaly is noted. No pneumothorax is noted. Minimal bibasilar subsegmental  atelectasis or edema is noted with small bilateral pleural effusions. Bony thorax is unremarkable. IMPRESSION: Minimal bibasilar subsegmental atelectasis or edema is noted with small bilateral pleural effusions. Aortic Atherosclerosis (ICD10-I70.0). Electronically Signed   By: Lupita Raider M.D.   On: 04/22/2021 12:32   DG Tibia/Fibula Right  Result Date: 04/14/2021 CLINICAL DATA:  Left leg pain. History of ankle fracture 1 year ago. EXAM: RIGHT TIBIA AND FIBULA - 2 VIEW COMPARISON:  03/11/2020 FINDINGS: Chronic ununited transverse fracture of  the medial malleolus. Healed fracture of the distal fibular metaphysis. No acute fracture or dislocation. Severe lateral femorotibial compartment joint space narrowing. Moderate medial femorotibial compartment joint space narrowing. Severe patellofemoral compartment joint space narrowing. Generalized osteopenia. Generalized soft tissue edema of the right lower leg. Soft tissue swelling more focally around the ankle. IMPRESSION: 1.  No acute osseous injury of the right lower leg. 2. 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 3. Healed fracture of the distal fibular metaphysis. Electronically Signed   By: Elige KoHetal  Patel M.D.   On: 04/14/2021 16:21   CT Angio Chest PE W/Cm &/Or Wo Cm  Result Date: 04/22/2021 CLINICAL DATA:  Dyspnea. EXAM: CT ANGIOGRAPHY CHEST WITH CONTRAST TECHNIQUE: Multidetector CT imaging of the chest was performed using the standard protocol during bolus administration of intravenous contrast. Multiplanar CT image reconstructions and MIPs were obtained to evaluate the vascular anatomy. CONTRAST:  60mL OMNIPAQUE IOHEXOL 350 MG/ML SOLN COMPARISON:  Radiograph of same day. FINDINGS: Cardiovascular: Satisfactory opacification of the pulmonary arteries to the segmental level. No evidence of pulmonary embolism. Mild cardiomegaly is noted. Atherosclerosis of thoracic aorta is noted without aneurysm formation. Coronary artery calcifications  are noted. No pericardial effusion. Mediastinum/Nodes: No enlarged mediastinal, hilar, or axillary lymph nodes. Thyroid gland, trachea, and esophagus demonstrate no significant findings. Lungs/Pleura: No pneumothorax is noted. Moderate size right pleural effusion is noted with adjacent subsegmental atelectasis of the right lower lobe. Small left pleural effusion is noted with adjacent subsegmental atelectasis of the left lower lobe. Focal left upper lobe opacity is noted peripherally consistent with subsegmental atelectasis or possibly pneumonia. Upper Abdomen: No acute abnormality. Musculoskeletal: No chest wall abnormality. No acute or significant osseous findings. Review of the MIP images confirms the above findings. IMPRESSION: No definite evidence of pulmonary embolus. Coronary artery calcifications are noted. Bilateral pleural effusions are noted with adjacent subsegmental atelectasis, right greater than left. Small focal opacity is noted peripherally in the left upper lobe concerning for subsegmental atelectasis or possibly pneumonia. Aortic Atherosclerosis (ICD10-I70.0). Electronically Signed   By: Lupita RaiderJames  Green Jr M.D.   On: 04/22/2021 14:14   US Venous Img Lower Unilateral Right  Result Date: 04/14/2021 CLINICAL DATA:  Right lower extremity pain and edema the past 6 days after hitting leg on stool. Evaluate for DVT. EXAM: RIGHT LOWER EXTREMITY VENOUS DOPPLER ULTRASOUND TECHNIQUE: Gray-scale sonography with graded compression, as well as color Doppler and duplex ultrasound were performed to evaluate the lower extremity deep venous systems from the level of the common femoral vein and including the common femoral, femoral, profunda femoral, popliteal and calf veins including the posterior tibial, peroneal and gastrocnemius veins when visible. The superficial great saphenous vein was also interrogated. Spectral Doppler was utilized to evaluate flow at rest and with distal augmentation maneuvers in the  common femoral, femoral and popliteal veins. COMPARISON:  Right lower extremity venous Doppler ultrasound-02/14/2008 (negative). FINDINGS: Contralateral Common Femoral Vein: Respiratory phasicity is normal and symmetric with the symptomatic side. No evidence of thrombus. Normal compressibility. Common Femoral Vein: No evidence of thrombus. Normal compressibility, respiratory phasicity and response to augmentation. Saphenofemoral Junction: No evidence of thrombus. Normal compressibility and flow on color Doppler imaging. Profunda Femoral Vein: No evidence of thrombus. Normal compressibility and flow on color Doppler imaging. Femoral Vein: No evidence of thrombus. Normal compressibility, respiratory phasicity and response to augmentation. Popliteal Vein: No evidence of thrombus. Normal compressibility, respiratory phasicity and response to augmentation. Calf Veins: No evidence of thrombus. Normal compressibility and flow on color  Doppler imaging. Superficial Great Saphenous Vein: No evidence of thrombus. Normal compressibility. Venous Reflux:  None. Other Findings: Sonographic evaluation of the patient's area of bruising involving the medial aspect the right calf correlates with an approximately 6.7 x 6.2 x 2.7 cm bilobed hypoechoic apparent collection. IMPRESSION: 1. No evidence of DVT within the right lower extremity. 2. Sonographic area evaluation of patient's area of bruising involving the medial aspect of right calf correlates with an approximately 6.7 cm bilobed hypoechoic apparent collection, nonspecific though given provided history of recent injury may represent evolving hematoma. Clinical correlation is advised. Electronically Signed   By: Simonne Come M.D.   On: 04/14/2021 14:54   ECHOCARDIOGRAM COMPLETE  Result Date: 04/23/2021    ECHOCARDIOGRAM REPORT   Patient Name:   Krystal Hoffman Date of Exam: 04/22/2021 Medical Rec #:  [id number]   Height:       67.0 in Accession #:    [id number]  Weight:       250.0  lb Date of Birth:  01/06/1951   BSA:          2.223 m Patient Age:    69 years    BP:           132/97 mmHg Patient Gender: F           HR:           81 bpm. Exam Location:  [facility] Procedure: 2D Echo, Cardiac Doppler and Color Doppler Indications:     I50.21 Acute Systolic Heart Failure  History:         Patient has no prior history of Echocardiogram examinations.                  Risk Factors:Hypertension, Diabetes and Dyslipidemia. Anxiety.  Sonographer:     Daphine Deutscher RDCS Referring Phys:  [id number] Deno [facility] Diagnosing Phys: Alwyn Pea MD IMPRESSIONS  1. Borderline inferior Hypo. Left ventricular ejection fraction, by estimation, is 50 to 55%. The left ventricle has low normal function. The left ventricle demonstrates regional wall motion abnormalities (see scoring diagram/findings for description). Left ventricular diastolic parameters were normal.  2. Right ventricular systolic function is normal. The right ventricular size is normal.  3. The mitral valve is normal in structure. Mild mitral valve regurgitation.  4. The aortic valve is normal in structure. Aortic valve regurgitation is not visualized. FINDINGS  Left Ventricle: Borderline inferior Hypo. Left ventricular ejection fraction, by estimation, is 50 to 55%. The left ventricle has low normal function. The left ventricle demonstrates regional wall motion abnormalities. The left ventricular internal cavity size was normal in size. There is no left ventricular hypertrophy. Left ventricular diastolic parameters were normal. Right Ventricle: The right ventricular size is normal. No increase in right ventricular wall thickness. Right ventricular systolic function is normal. Left Atrium: Left atrial size was normal in size. Right Atrium: Right atrial size was normal in size. Pericardium: There is no evidence of pericardial effusion. Mitral Valve: The mitral valve is normal in structure. Mild mitral valve regurgitation. Tricuspid Valve:  The tricuspid valve is normal in structure. Tricuspid valve regurgitation is mild. Aortic Valve: The aortic valve is normal in structure. Aortic valve regurgitation is not visualized. Aortic valve mean gradient measures 9.4 mmHg. Aortic valve peak gradient measures 14.9 mmHg. Aortic valve area, by VTI measures 1.92 cm. Pulmonic Valve: The pulmonic valve was normal in structure. Pulmonic valve regurgitation is not visualized. Aorta: The ascending aorta was not well visualized. IAS/Shunts:  No atrial level shunt detected by color flow Doppler.  LEFT VENTRICLE PLAX 2D LVIDd:         4.64 cm LVIDs:         3.33 cm LV PW:         1.13 cm LV IVS:        1.13 cm LVOT diam:     2.00 cm LV SV:         61 LV SV Index:   27 LVOT Area:     3.14 cm  RIGHT VENTRICLE             IVC RV Basal diam:  4.83 cm     IVC diam: 2.48 cm RV S prime:     13.40 cm/s TAPSE (M-mode): 2.6 cm LEFT ATRIUM             Index       RIGHT ATRIUM           Index LA diam:        4.70 cm 2.11 cm/m  RA Area:     18.50 cm LA Vol (A2C):   68.0 ml 30.58 ml/m RA Volume:   55.80 ml  25.10 ml/m LA Vol (A4C):   73.1 ml 32.88 ml/m LA Biplane Vol: 70.8 ml 31.84 ml/m  AORTIC VALVE AV Area (Vmax):    1.88 cm AV Area (Vmean):   1.74 cm AV Area (VTI):     1.92 cm AV Vmax:           192.80 cm/s AV Vmean:          147.000 cm/s AV VTI:            0.318 m AV Peak Grad:      14.9 mmHg AV Mean Grad:      9.4 mmHg LVOT Vmax:         115.50 cm/s LVOT Vmean:        81.300 cm/s LVOT VTI:          0.194 m LVOT/AV VTI ratio: 0.61  AORTA Ao Root diam: 2.70 cm MV E velocity: 117.00 cm/s                             SHUNTS                             Systemic VTI:  0.19 m                             Systemic Diam: 2.00 cm Dwayne D Callwood MD Electronically signed by Alwyn Pea MD Signature Date/Time: 04/23/2021/7:58:54 AM    Final    (Echo, Carotid, EGD, Colonoscopy, ERCP)    Subjective: Pt denies any complaints    Discharge Exam: Vitals:   04/29/21 0749  04/29/21 1142  BP: (!) 156/99 125/69  Pulse: 69 63  Resp: 16 16  Temp: 98.3 F (36.8 C) 98.1 F (36.7 C)  SpO2: (!) 87% 92%   Vitals:   04/28/21 2346 04/29/21 0429 04/29/21 0749 04/29/21 1142  BP: 117/84 137/88 (!) 156/99 125/69  Pulse: (!) 57 67 69 63  Resp: Temp:  97.8 F (36.6 C) 98.3 F (36.8 C) 98.1 F (36.7 C)  TempSrc:      SpO2: 91% 90% (!) 87% 92%  Weight: 115.2  kg  114.4 kg   Height:        General: Pt is alert, awake, not in acute distress Cardiovascular: S1/S2 +, no rubs, no gallops Respiratory: diminished breath sounds b/l otherwise clear Abdominal: Soft, NT, ND, bowel sounds + Extremities: RLE swelling reduced from days prior, no cyanosis    The results of significant diagnostics from this hospitalization (including imaging, microbiology, ancillary and laboratory) are listed below for reference.     Microbiology: Recent Results (from the past 240 hour(s))  Resp Panel by RT-PCR (Flu A&B, Covid) Nasopharyngeal Swab     Status: None   Collection Time: 04/22/21  1:25 PM   Specimen: Nasopharyngeal Swab; Nasopharyngeal(NP) swabs in vial transport medium  Result Value Ref Range Status   SARS Coronavirus 2 by RT PCR NEGATIVE NEGATIVE Final    Comment: (NOTE) SARS-CoV-2 target nucleic acids are NOT DETECTED.  The SARS-CoV-2 RNA is generally detectable in upper respiratory specimens during the acute phase of infection. The lowest concentration of SARS-CoV-2 viral copies this assay can detect is 138 copies/mL. A negative result does not preclude SARS-Cov-2 infection and should not be used as the sole basis for treatment or other patient management decisions. A negative result may occur with  improper specimen collection/handling, submission of specimen other than nasopharyngeal swab, presence of viral mutation(s) within the areas targeted by this assay, and inadequate number of viral copies(<138 copies/mL). A negative result must be combined  with clinical observations, patient history, and epidemiological information. The expected result is Negative.  Fact Sheet for Patients:  BloggerCourse.com  Fact Sheet for Healthcare Providers:  SeriousBroker.it  This test is no t yet approved or cleared by the [place] FDA and  has been authorized for detection and/or diagnosis of SARS-CoV-2 by FDA under an Emergency Use Authorization (EUA). This EUA will remain  in effect (meaning this test can be used) for the duration of the COVID-19 declaration under Section 564(b)(1) of the Act, 21 U.S.C.section 360bbb-3(b)(1), unless the authorization is terminated  or revoked sooner.       Influenza A by PCR NEGATIVE NEGATIVE Final   Influenza B by PCR NEGATIVE NEGATIVE Final    Comment: (NOTE) The Xpert Xpress SARS-CoV-2/FLU/RSV plus assay is intended as an aid in the diagnosis of influenza from Nasopharyngeal swab specimens and should not be used as a sole basis for treatment. Nasal washings and aspirates are unacceptable for Xpert Xpress SARS-CoV-2/FLU/RSV testing.  Fact Sheet for Patients: BloggerCourse.com  Fact Sheet for Healthcare Providers: SeriousBroker.it  This test is not yet approved or cleared by the [place] FDA and has been authorized for detection and/or diagnosis of SARS-CoV-2 by FDA under an Emergency Use Authorization (EUA). This EUA will remain in effect (meaning this test can be used) for the duration of the COVID-19 declaration under Section 564(b)(1) of the Act, 21 U.S.C. section 360bbb-3(b)(1), unless the authorization is terminated or revoked.  Performed at [facility], [address] [place], [place] [address]      Labs: BNP (last 3 results) Recent Labs    04/22/21 1153  BNP 426.8*   Basic Metabolic Panel: Recent Labs  Lab 04/23/21 0630 04/24/21 0428 04/25/21 0412  04/26/21 0457 04/27/21 0537 04/28/21 0454 04/29/21 0424  NA 145   < > 141 139 140 140 140  K 4.2   < > 3.8 4.5 4.2 4.7 3.9  CL 101   < > 99 100 100 100 98  CO2 34*   < > 35* 33*  35* 36* 34*  GLUCOSE 84   < > 96 126* 120* 98 146*  BUN 37*   < > 37* 44* 49* 40* 33*  CREATININE 1.69*   < > 1.70* 1.82* 1.64* 1.56* 1.35*  CALCIUM 9.0   < > 8.4* 8.7* 8.8* 8.7* 8.9  MG 2.2  --   --   --   --   --   --    < > = values in this interval not displayed.   Liver Function Tests: Recent Labs  Lab 04/23/21 0630  AST 18  ALT 16  ALKPHOS 64  BILITOT 1.3*  PROT 6.9  ALBUMIN 3.4*   No results for input(s): LIPASE, AMYLASE in the last 168 hours. No results for input(s): AMMONIA in the last 168 hours. CBC: Recent Labs  Lab 04/23/21 0630 04/24/21 0428 04/25/21 0412 04/26/21 0457 04/27/21 0537 04/28/21 0454 04/29/21 0424  WBC 5.7   < > 5.7 6.0 4.6 4.4 4.6  NEUTROABS 4.0  --   --   --   --   --   --   HGB 11.3*   < > 10.6* 10.8* 10.8* 10.8* 10.9*  HCT 38.1   < > 35.8* 36.0 36.1 37.1 35.6*  MCV 87.4   < > 87.3 88.9 87.8 89.2 86.0  PLT 281   < > 243 214 195 177 177   < > = values in this interval not displayed.   Cardiac Enzymes: No results for input(s): CKTOTAL, CKMB, CKMBINDEX, TROPONINI in the last 168 hours. BNP: Invalid input(s): POCBNP CBG: Recent Labs  Lab 04/28/21 1609 04/28/21 2108 04/29/21 1000 04/29/21 1141 04/29/21 1143  GLUCAP 178* 169* 197* 285* 222*   D-Dimer No results for input(s): DDIMER in the last 72 hours. Hgb A1c No results for input(s): HGBA1C in the last 72 hours. Lipid Profile No results for input(s): CHOL, HDL, LDLCALC, TRIG, CHOLHDL, LDLDIRECT in the last 72 hours. Thyroid function studies No results for input(s): TSH, T4TOTAL, T3FREE, THYROIDAB in the last 72 hours.  Invalid input(s): FREET3 Anemia work up No results for input(s): VITAMINB12, FOLATE, FERRITIN, TIBC, IRON, RETICCTPCT in the last 72 hours. Urinalysis    Component Value  Date/Time   COLORURINE YELLOW (A) 03/10/2020 0435   APPEARANCEUR HAZY (A) 03/10/2020 0435   LABSPEC 1.014 03/10/2020 0435   PHURINE 5.0 03/10/2020 0435   GLUCOSEU NEGATIVE 03/10/2020 0435   HGBUR NEGATIVE 03/10/2020 0435   BILIRUBINUR NEGATIVE 03/10/2020 0435   KETONESUR NEGATIVE 03/10/2020 0435   PROTEINUR 30 (A) 03/10/2020 0435   NITRITE NEGATIVE 03/10/2020 0435   LEUKOCYTESUR NEGATIVE 03/10/2020 0435   Sepsis Labs Invalid input(s): PROCALCITONIN,  WBC,  LACTICIDVEN Microbiology Recent Results (from the past 240 hour(s))  Resp Panel by RT-PCR (Flu A&B, Covid) Nasopharyngeal Swab     Status: None   Collection Time: 04/22/21  1:25 PM   Specimen: Nasopharyngeal Swab; Nasopharyngeal(NP) swabs in vial transport medium  Result Value Ref Range Status   SARS Coronavirus 2 by RT PCR NEGATIVE NEGATIVE Final    Comment: (NOTE) SARS-CoV-2 target nucleic acids are NOT DETECTED.  The SARS-CoV-2 RNA is generally detectable in upper respiratory specimens during the acute phase of infection. The lowest concentration of SARS-CoV-2 viral copies this assay can detect is 138 copies/mL. A negative result does not preclude SARS-Cov-2 infection and should not be used as the sole basis for treatment or other patient management decisions. A negative result may occur with  improper specimen collection/handling, submission of specimen other than nasopharyngeal  swab, presence of viral mutation(s) within the areas targeted by this assay, and inadequate number of viral copies(<138 copies/mL). A negative result must be combined with clinical observations, patient history, and epidemiological information. The expected result is Negative.  Fact Sheet for Patients:  BloggerCourse.com  Fact Sheet for Healthcare Providers:  SeriousBroker.it  This test is no t yet approved or cleared by the [place] FDA and  has been authorized for detection and/or  diagnosis of SARS-CoV-2 by FDA under an Emergency Use Authorization (EUA). This EUA will remain  in effect (meaning this test can be used) for the duration of the COVID-19 declaration under Section 564(b)(1) of the Act, 21 U.S.C.section 360bbb-3(b)(1), unless the authorization is terminated  or revoked sooner.       Influenza A by PCR NEGATIVE NEGATIVE Final   Influenza B by PCR NEGATIVE NEGATIVE Final    Comment: (NOTE) The Xpert Xpress SARS-CoV-2/FLU/RSV plus assay is intended as an aid in the diagnosis of influenza from Nasopharyngeal swab specimens and should not be used as a sole basis for treatment. Nasal washings and aspirates are unacceptable for Xpert Xpress SARS-CoV-2/FLU/RSV testing.  Fact Sheet for Patients: BloggerCourse.com  Fact Sheet for Healthcare Providers: SeriousBroker.it  This test is not yet approved or cleared by the [place] FDA and has been authorized for detection and/or diagnosis of SARS-CoV-2 by FDA under an Emergency Use Authorization (EUA). This EUA will remain in effect (meaning this test can be used) for the duration of the COVID-19 declaration under Section 564(b)(1) of the Act, 21 U.S.C. section 360bbb-3(b)(1), unless the authorization is terminated or revoked.  Performed at [facility], [address], [place], [place] [address]      Time coordinating discharge: Over 30 minutes  SIGNED:   Charise Killian, MD  [facility] 04/29/2021, 2:05 PM Pager   If 7PM-7AM, please contact night-coverage

## 2021-04-29 NOTE — Progress Notes (Signed)
Discharge instructions explained to pt/ verbalized an understanding/ iv and tele removed/ o2 delivered to room / will transport off unit via wheelchair.

## 2021-04-29 NOTE — Progress Notes (Signed)
[facility]    SUBJECTIVE:  - Hypoxia remains an issue. Ambulated yesterday with O2 decreasing to low 80%. - No acute events.  - Frustrated with ongoing issues with hypoxia.    Vitals:   04/28/21 1948 04/28/21 2346 04/29/21 0429 04/29/21 0749  BP: 131/86 117/84 137/88 (!) 156/99  Pulse: 79 (!) 57 67 69  Resp: 19 18 18 16   Temp: 98.6 F (37 C)  97.8 F (36.6 C) 98.3 F (36.8 C)  TempSrc: Oral     SpO2: 91% 91% 90% (!) 87%  Weight:  115.2 kg    Height:         Intake/Output Summary (Last 24 hours) at 04/29/2021 0807 Last data filed at 04/29/2021 0745 Gross per 24 hour  Intake 1200 ml  Output 1100 ml  Net 100 ml       PHYSICAL EXAM  General: Well developed, well nourished, in no acute distress HEENT:  Normocephalic and atramatic Neck:  No JVD.  Lungs: Decreased breath sounds in BL bases. Heart: HRRR . Normal S1 and S2 without gallops or murmurs.  Abdomen: Bowel sounds are positive, abdomen soft and non-tender  Msk:  Back normal, normal gait. Normal strength and tone for age. Extremities: No clubbing, cyanosis or edema.  Leg swelling ecchymosis hematoma on left leg is improving.  Neuro: Alert and oriented X 3. Psych:  Good affect, responds appropriately   LABS: Basic Metabolic Panel: Recent Labs    04/28/21 0454 04/29/21 0424  NA 140 140  K 4.7 3.9  CL 100 98  CO2 36* 34*  GLUCOSE 98 146*  BUN 40* 33*  CREATININE 1.56* 1.35*  CALCIUM 8.7* 8.9    Liver Function Tests: No results for input(s): AST, ALT, ALKPHOS, BILITOT, PROT, ALBUMIN in the last 72 hours.  No results for input(s): LIPASE, AMYLASE in the last 72 hours. CBC: Recent Labs    04/28/21 0454 04/29/21 0424  WBC 4.4 4.6  HGB 10.8* 10.9*  HCT 37.1 35.6*  MCV 89.2 86.0  PLT 177 177    Cardiac Enzymes: No results for input(s): CKTOTAL, CKMB, CKMBINDEX, TROPONINI in the last 72 hours. BNP: Invalid input(s): POCBNP D-Dimer: No results for input(s): DDIMER in the last 72  hours. Hemoglobin A1C: No results for input(s): HGBA1C in the last 72 hours.  Fasting Lipid Panel: No results for input(s): CHOL, HDL, LDLCALC, TRIG, CHOLHDL, LDLDIRECT in the last 72 hours. Thyroid Function Tests: No results for input(s): TSH, T4TOTAL, T3FREE, THYROIDAB in the last 72 hours.  Invalid input(s): FREET3 Anemia Panel: No results for input(s): VITAMINB12, FOLATE, FERRITIN, TIBC, IRON, RETICCTPCT in the last 72 hours.  DG Chest 2 View  Result Date: 04/28/2021 CLINICAL DATA:  Hypoxemia. EXAM: CHEST - 2 VIEW COMPARISON:  April 22, 2021. FINDINGS: Stable cardiomegaly. No pneumothorax is noted. Increased bibasilar atelectasis and bilateral pleural effusions are noted. Bony thorax is unremarkable. IMPRESSION: Increased bibasilar atelectasis and pleural effusions are noted. Electronically Signed   By: April 24, 2021 M.D.   On: 04/28/2021 12:59     Echo borderline overall left ventricular function ejection fraction around 50%  TELEMETRY: Sinus bradycardia rate of 60:  ASSESSMENT AND PLAN:  Principal Problem:   Acute on chronic congestive heart failure (HCC) Active Problems:   Type 2 diabetes mellitus with stage 3b chronic kidney disease, without long-term current use of insulin (HCC)   GERD without esophagitis   Essential hypertension   Chronic kidney disease, stage 3b (HCC)   Mixed diabetic hyperlipidemia associated with  type 2 diabetes mellitus (HCC)   Persistent atrial fibrillation (HCC)   70 year old female with history of persistent atrial fibrillation, chronic kidney disease stage III, hypertension, type 2 diabetes, who was admitted with a thigh hematoma.   Plan #Right lower extremity hematoma #Chronic atrial fibrillation Suffered a thigh hematoma when she fell from a bench and injured her right lower extremity.  This occurred on 8/15. -Continue to hold Eliquis -Continue amiodarone and metoprolol (outpatient medications). Continue metoprolol to XL 12.5 mg daily.   -Continue elevation and warm compresses. - Follow up with Gwen Pounds in 1-2 weeks.   # Shortness of breath # Hypoxemia Echocardiogram showed an EF of 50 to 55% with normal diastolic dysfunction.  Normal RV function and no valvular pathology.  She had shortness of breath on presentation and has been treated with IV Lasix daily, though this persists despite appearing euvolemic. Need to consider alternative etiologies for hypoxia. -PO lasix 40 mg daily  -Encouraged her to ambulate today more to assess how she is feeling.  - Would delay stress test which is scheduled for this week.   #Chronic medical conditions -Continue statin therapy and diabetes management.    Armando Reichert, MD, 04/29/2021 8:07 AM

## 2021-04-29 NOTE — Progress Notes (Signed)
SATURATION QUALIFICATIONS: (This note is used to comply with regulatory documentation for home oxygen)  Patient Saturations on Room Air at Rest = 86%   Patient Saturations on 2 Liters of oxygen while Ambulating = 93%

## 2021-04-29 NOTE — Progress Notes (Signed)
SATURATION QUALIFICATIONS: (This note is used to comply with regulatory documentation for home oxygen)  Patient Saturations on Room Air at Rest =86%   

## 2021-05-02 ENCOUNTER — Ambulatory Visit: Payer: Medicare PPO | Admitting: Family

## 2021-10-08 ENCOUNTER — Other Ambulatory Visit: Payer: Self-pay | Admitting: Internal Medicine

## 2021-10-08 DIAGNOSIS — Z1231 Encounter for screening mammogram for malignant neoplasm of breast: Secondary | ICD-10-CM

## 2021-11-13 ENCOUNTER — Ambulatory Visit
Admission: RE | Admit: 2021-11-13 | Discharge: 2021-11-13 | Disposition: A | Discharge status: Home / Self Care | Payer: Medicare PPO | Source: Ambulatory Visit | Attending: Internal Medicine | Admitting: Internal Medicine

## 2021-11-13 DIAGNOSIS — Z1231 Encounter for screening mammogram for malignant neoplasm of breast: Secondary | ICD-10-CM | POA: Diagnosis not present

## 2022-10-28 ENCOUNTER — Other Ambulatory Visit: Payer: Self-pay | Admitting: Internal Medicine

## 2022-10-28 DIAGNOSIS — Z1231 Encounter for screening mammogram for malignant neoplasm of breast: Secondary | ICD-10-CM

## 2022-12-04 ENCOUNTER — Emergency Department: Payer: Medicare PPO

## 2022-12-04 ENCOUNTER — Other Ambulatory Visit: Payer: Self-pay

## 2022-12-04 ENCOUNTER — Emergency Department
Admission: EM | Admit: 2022-12-04 | Discharge: 2022-12-04 | Disposition: A | Discharge status: Home / Self Care | Payer: Medicare PPO | Attending: Emergency Medicine | Admitting: Emergency Medicine

## 2022-12-04 DIAGNOSIS — E860 Dehydration: Secondary | ICD-10-CM | POA: Diagnosis not present

## 2022-12-04 DIAGNOSIS — Z1152 Encounter for screening for COVID-19: Secondary | ICD-10-CM | POA: Insufficient documentation

## 2022-12-04 DIAGNOSIS — R55 Syncope and collapse: Secondary | ICD-10-CM | POA: Diagnosis present

## 2022-12-04 DIAGNOSIS — J189 Pneumonia, unspecified organism: Secondary | ICD-10-CM | POA: Insufficient documentation

## 2022-12-04 DIAGNOSIS — Z7901 Long term (current) use of anticoagulants: Secondary | ICD-10-CM | POA: Insufficient documentation

## 2022-12-04 LAB — URINALYSIS, ROUTINE W REFLEX MICROSCOPIC
Bilirubin Urine: NEGATIVE
Glucose, UA: NEGATIVE mg/dL
Ketones, ur: 20 mg/dL — AB
Nitrite: NEGATIVE
Protein, ur: >=300 mg/dL — AB
Specific Gravity, Urine: 1.023 (ref 1.005–1.030)
pH: 5 (ref 5.0–8.0)

## 2022-12-04 LAB — RESP PANEL BY RT-PCR (RSV, FLU A&B, COVID)  RVPGX2
Influenza A by PCR: NEGATIVE
Influenza B by PCR: NEGATIVE
Resp Syncytial Virus by PCR: NEGATIVE
SARS Coronavirus 2 by RT PCR: NEGATIVE

## 2022-12-04 LAB — BASIC METABOLIC PANEL
Anion gap: 15 (ref 5–15)
BUN: 38 mg/dL — ABNORMAL HIGH (ref 8–23)
CO2: 21 mmol/L — ABNORMAL LOW (ref 22–32)
Calcium: 8.9 mg/dL (ref 8.9–10.3)
Chloride: 99 mmol/L (ref 98–111)
Creatinine, Ser: 1.84 mg/dL — ABNORMAL HIGH (ref 0.44–1.00)
GFR, Estimated: 29 mL/min — ABNORMAL LOW (ref >60)
Glucose, Bld: 157 mg/dL — ABNORMAL HIGH (ref 70–99)
Potassium: 3.9 mmol/L (ref 3.5–5.1)
Sodium: 135 mmol/L (ref 135–145)

## 2022-12-04 LAB — CBC
HCT: 46.5 % — ABNORMAL HIGH (ref 36.0–46.0)
Hemoglobin: 14.6 g/dL (ref 12.0–15.0)
MCH: 26.9 pg (ref 26.0–34.0)
MCHC: 31.4 g/dL (ref 30.0–36.0)
MCV: 85.8 fL (ref 80.0–100.0)
Platelets: 303 10*3/uL (ref 150–400)
RBC: 5.42 MIL/uL — ABNORMAL HIGH (ref 3.87–5.11)
RDW: 13.8 % (ref 11.5–15.5)
WBC: 11.2 10*3/uL — ABNORMAL HIGH (ref 4.0–10.5)
nRBC: 0 % (ref 0.0–0.2)

## 2022-12-04 LAB — TROPONIN I (HIGH SENSITIVITY)
Troponin I (High Sensitivity): 30 ng/L — ABNORMAL HIGH (ref <18)
Troponin I (High Sensitivity): 30 ng/L — ABNORMAL HIGH (ref <18)

## 2022-12-04 MED ORDER — SODIUM CHLORIDE 0.9 % IV BOLUS
500.0000 mL | Freq: Once | INTRAVENOUS | Status: AC
  Administered 2022-12-04: 500 mL via INTRAVENOUS

## 2022-12-04 MED ORDER — SODIUM CHLORIDE 0.9 % IV BOLUS
1000.0000 mL | Freq: Once | INTRAVENOUS | Status: AC
  Administered 2022-12-04: 1000 mL via INTRAVENOUS

## 2022-12-04 NOTE — ED Notes (Signed)
D/C, and RX discussed with pt, pt verbalized understanding. NAD noted. Pt given scrubs and mesh underwear for D/C.

## 2022-12-04 NOTE — ED Notes (Signed)
Lab called to assist with repeat trop draw

## 2022-12-04 NOTE — ED Provider Notes (Signed)
[facility] Provider Note    Event Date/Time   First MD Initiated Contact with Patient 12/04/22 1318     (approximate)   History   Diarrhea and Irregular Heart Beat   HPI  [facility] is a 72 y.o. female who presents to the emergency department today after syncopal episode.  The patient states that for roughly the past 10 days she has been dealing with an upper respiratory infection.  She has had cough.  She recently started antibiotics a couple of days ago and actually feels better from that.  Today however she had an episode of very watery diarrhea.  Shortly there after her husband noticed that her eyes rolled to the back of her head and she passed out.  She denies any chest pain or palpitations when that occurred.  She does state that she has a history of A-fib intermittently. Is on eliquis but no head trauma.    Physical Exam   Triage Vital Signs: ED Triage Vitals  Enc Vitals Group     BP 12/04/22 1307 119/81     Pulse Rate 12/04/22 1307 75     Resp 12/04/22 1307 18     Temp 12/04/22 1307 98.3 F (36.8 C)     Temp Source 12/04/22 1307 Oral     SpO2 12/04/22 1307 94 %     Weight --      Height --      Head Circumference --      Peak Flow --      Pain Score 12/04/22 1309 0     Pain Loc --      Pain Edu? --      Excl. in GC? --     Most recent vital signs: Vitals:   12/04/22 1307  BP: 119/81  Pulse: 75  Resp: 18  Temp: 98.3 F (36.8 C)  SpO2: 94%   General: Awake, alert, oriented. CV:  Good peripheral perfusion. Regular rate and rhythm Resp:  Normal effort. Lungs clear. Abd:  No distention.    ED Results / Procedures / Treatments   Labs (all labs ordered are listed, but only abnormal results are displayed) Labs Reviewed  CBC - Abnormal; Notable for the following components:      Result Value   WBC 11.2 (*)    RBC 5.42 (*)    HCT 46.5 (*)    All other components within normal limits  URINALYSIS, ROUTINE W REFLEX MICROSCOPIC  - Abnormal; Notable for the following components:   Color, Urine AMBER (*)    APPearance CLOUDY (*)    Hgb urine dipstick SMALL (*)    Ketones, ur 20 (*)    Protein, ur >=300 (*)    Leukocytes,Ua MODERATE (*)    Bacteria, UA MANY (*)    All other components within normal limits  BASIC METABOLIC PANEL - Abnormal; Notable for the following components:   CO2 21 (*)    Glucose, Bld 157 (*)    BUN 38 (*)    Creatinine, Ser 1.84 (*)    GFR, Estimated 29 (*)    All other components within normal limits  TROPONIN I (HIGH SENSITIVITY) - Abnormal; Notable for the following components:   Troponin I (High Sensitivity) 30 (*)    All other components within normal limits  RESP PANEL BY RT-PCR (RSV, FLU A&B, COVID)  [id number]  CBG MONITORING, ED  TROPONIN I (HIGH SENSITIVITY)     EKG  I, Phineas Semen, attending  physician, personally viewed and interpreted this EKG  EKG Time: 1308 Rate: 101 Rhythm: atrial fibrillation Axis: left axis deviation Intervals: qtc 541 QRS: RBBB, LAFB ST changes: no st elevation Impression: abnormal ekg   RADIOLOGY I independently interpreted and visualized the CXR. My interpretation: Right sided airspace disease Radiology interpretation:  IMPRESSION:  New heterogeneous airspace opacity within the right mid lung  concerning for pneumonia. Recommend follow-up chest x-ray 46 weeks  after treatment to ensure resolution.      PROCEDURES:  Critical Care performed: No   MEDICATIONS ORDERED IN ED: Medications - No data to display   IMPRESSION / MDM / ASSESSMENT AND PLAN / ED COURSE  I reviewed the triage vital signs and the nursing notes.                              Differential diagnosis includes, but is not limited to, vasovagal, cardiac etiology, anemia, electrolyte abnormality  Patient's presentation is most consistent with acute presentation with potential threat to life or bodily function.   The patient is on the cardiac monitor to  evaluate for evidence of arrhythmia and/or significant heart rate changes.  Patient presented to the emergency department today after a syncopal episode that occurred shortly after diarrhea.  Patient recently started on antibiotics for upper respiratory infection.  On exam here patient is awake and alert.  Patient did have some slight tachycardia upon arrival.  EKG shows A-fib which patient has history of.  Blood work without concerning leukocytosis or anemia.  Patient's creatinine is slightly elevated.  Patient was started on IV fluids and heart rate did not improve.  Initial troponin with some slight elevation although patient has had slight elevation in the past.  Will check repeat.  I think if repeat troponin without any significant change would be reasonable for patient be discharged home.  Do think syncope was likely secondary to vasovagal/dehydration.  Patient's chest x-ray is consistent with pneumonia patient is on appropriate treatment at this time.      FINAL CLINICAL IMPRESSION(S) / ED DIAGNOSES   Final diagnoses:  Syncope, unspecified syncope type  Dehydration  Pneumonia of right lung due to infectious organism, unspecified part of lung    Note:  This document was prepared using Dragon voice recognition software and may include unintentional dictation errors.    Phineas SemenGoodman, Armanie Martine, MD 12/04/22 [phone]

## 2022-12-04 NOTE — Discharge Instructions (Signed)
Please follow up with your primary care so they can monitor your pneumonia and repeat imaging in 4-6 weeks to confirm resolution. Please seek medical attention for any high fevers, chest pain, shortness of breath, change in behavior, persistent vomiting, bloody stool or any other new or concerning symptoms.

## 2022-12-04 NOTE — ED Triage Notes (Addendum)
Pt presents to ED via [facility] with c/o of diarrhea since 1130 this morning. EMS was called due to pt having a syncopal episode while on the toilet at home. Pt states known HX of a-fib and takes Eliquis, pt denies falling and hitting head.   Pt is A&Ox4. Pt denies ABD pain and N/V.   Pt was covered in stool on arrival, pt was brought to decon shower and cleaned off. NAD noted.   Pt does endorse she has recently been taking a z-pack for a few days for a cold. Pt denies fevers or chills.   EMS gave 400 ml of NS PTA.

## 2022-12-04 NOTE — ED Notes (Signed)
Pt to XRAY via stretcher. 

## 2023-01-08 ENCOUNTER — Ambulatory Visit
Admission: RE | Admit: 2023-01-08 | Discharge: 2023-01-08 | Disposition: A | Discharge status: Home / Self Care | Payer: Medicare PPO | Source: Ambulatory Visit | Attending: Internal Medicine | Admitting: Internal Medicine

## 2023-01-08 DIAGNOSIS — Z1231 Encounter for screening mammogram for malignant neoplasm of breast: Secondary | ICD-10-CM | POA: Insufficient documentation

## 2023-12-10 ENCOUNTER — Other Ambulatory Visit: Payer: Self-pay | Admitting: Internal Medicine

## 2023-12-10 DIAGNOSIS — Z1231 Encounter for screening mammogram for malignant neoplasm of breast: Secondary | ICD-10-CM

## 2024-01-13 ENCOUNTER — Encounter

## 2024-01-18 ENCOUNTER — Ambulatory Visit
Admission: RE | Admit: 2024-01-18 | Discharge: 2024-01-18 | Disposition: A | Discharge status: Home / Self Care | Source: Ambulatory Visit | Attending: Internal Medicine | Admitting: Internal Medicine

## 2024-01-18 DIAGNOSIS — Z1231 Encounter for screening mammogram for malignant neoplasm of breast: Secondary | ICD-10-CM | POA: Insufficient documentation

## 2024-01-21 ENCOUNTER — Other Ambulatory Visit: Payer: Self-pay | Admitting: Nephrology

## 2024-01-21 DIAGNOSIS — E1122 Type 2 diabetes mellitus with diabetic chronic kidney disease: Secondary | ICD-10-CM

## 2024-01-21 DIAGNOSIS — N184 Chronic kidney disease, stage 4 (severe): Secondary | ICD-10-CM

## 2024-01-28 ENCOUNTER — Ambulatory Visit
Admission: RE | Admit: 2024-01-28 | Discharge: 2024-01-28 | Disposition: A | Discharge status: Home / Self Care | Source: Ambulatory Visit | Attending: Nephrology | Admitting: Nephrology

## 2024-01-28 DIAGNOSIS — E1122 Type 2 diabetes mellitus with diabetic chronic kidney disease: Secondary | ICD-10-CM | POA: Diagnosis present

## 2024-01-28 DIAGNOSIS — N184 Chronic kidney disease, stage 4 (severe): Secondary | ICD-10-CM | POA: Diagnosis present

## 2024-02-16 IMAGING — MG MM DIGITAL SCREENING BILAT W/ TOMO AND CAD
6 of 10 series · 6 of 30 positions shown · non-contrast
Comparison: Previous exam(s).

CLINICAL DATA: Screening.

EXAM:
DIGITAL SCREENING BILATERAL MAMMOGRAM WITH TOMOSYNTHESIS AND CAD
TECHNIQUE: Bilateral screening digital craniocaudal and mediolateral oblique
mammograms were obtained. Bilateral screening digital breast
tomosynthesis was performed. The images were evaluated with
computer-aided detection.

[L MLO synth-2D (1 of 2)]
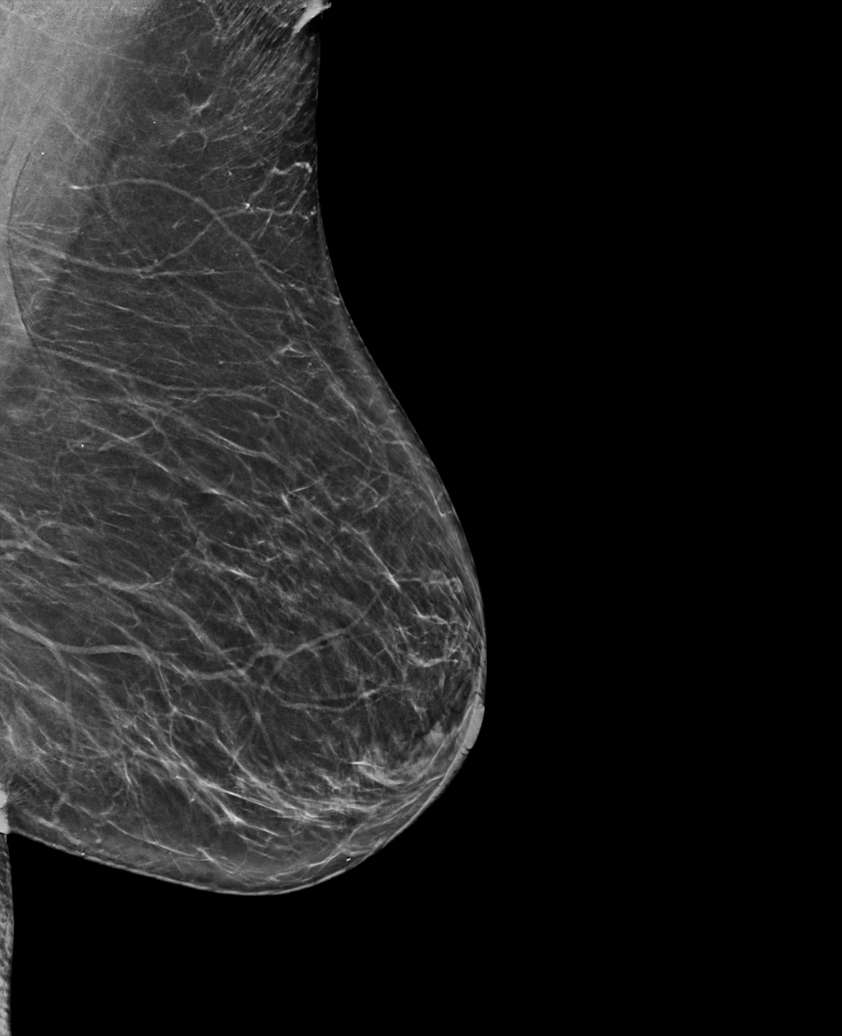

[R CC synth-2D]
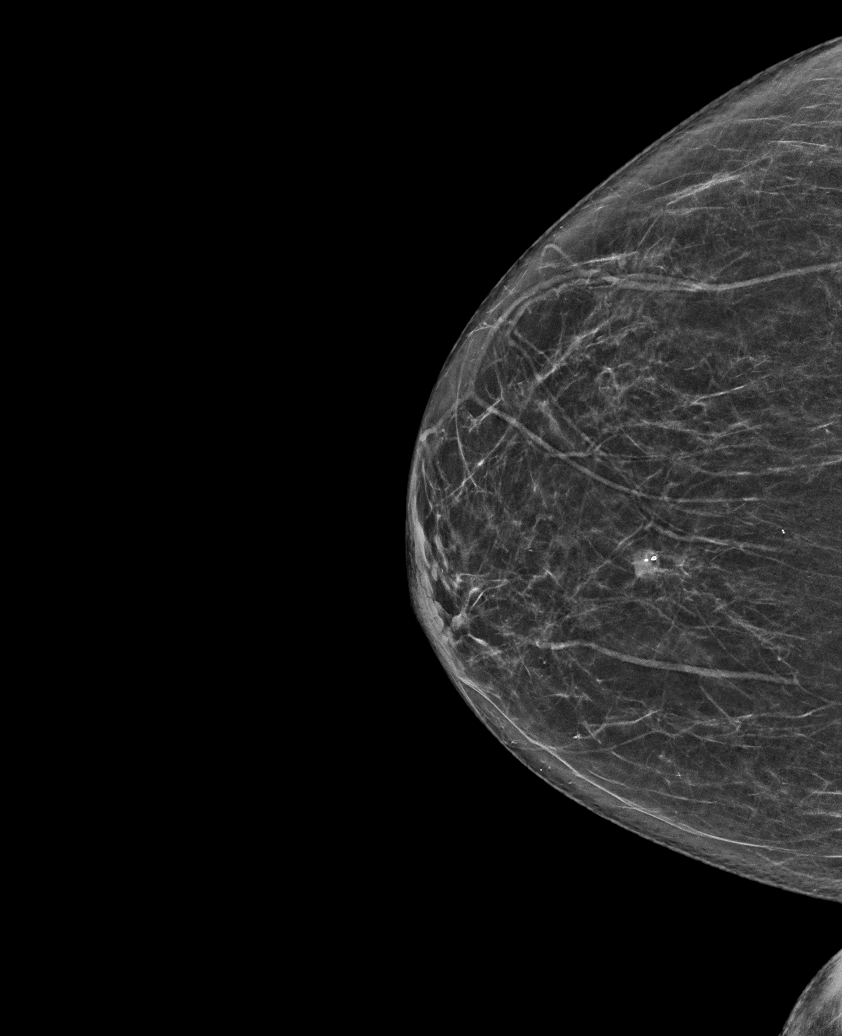

[L CC synth-2D]
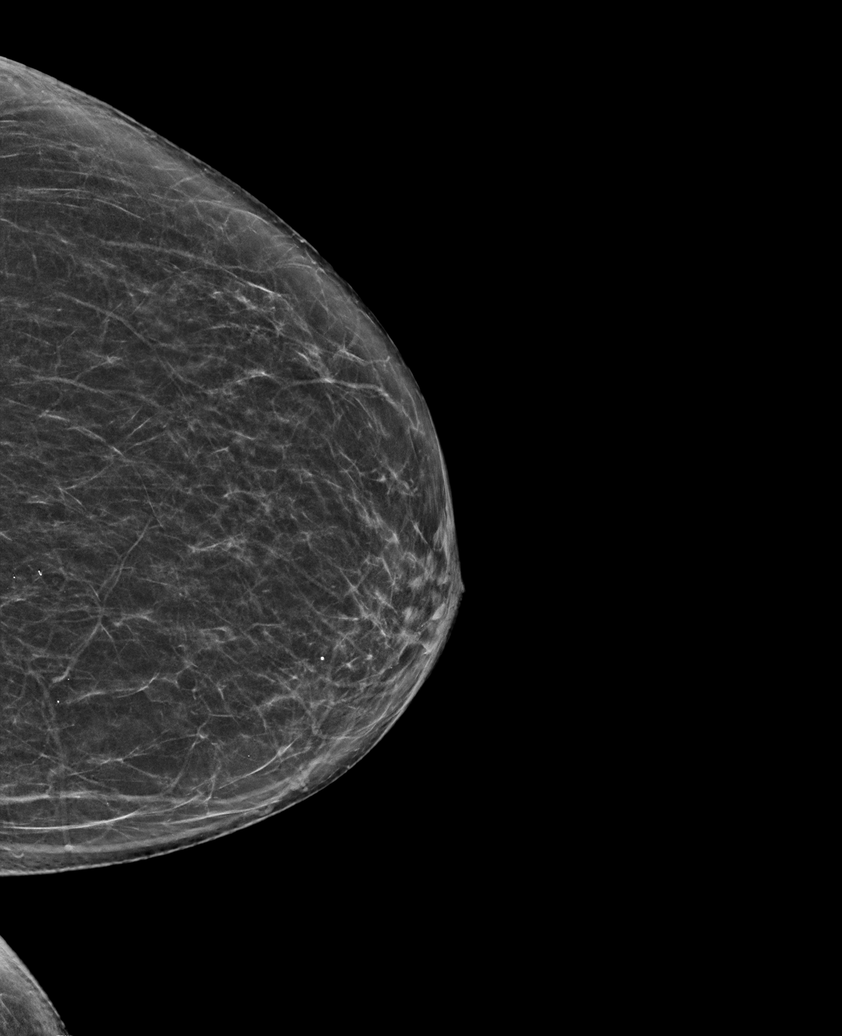

[R MLO synth-2D]
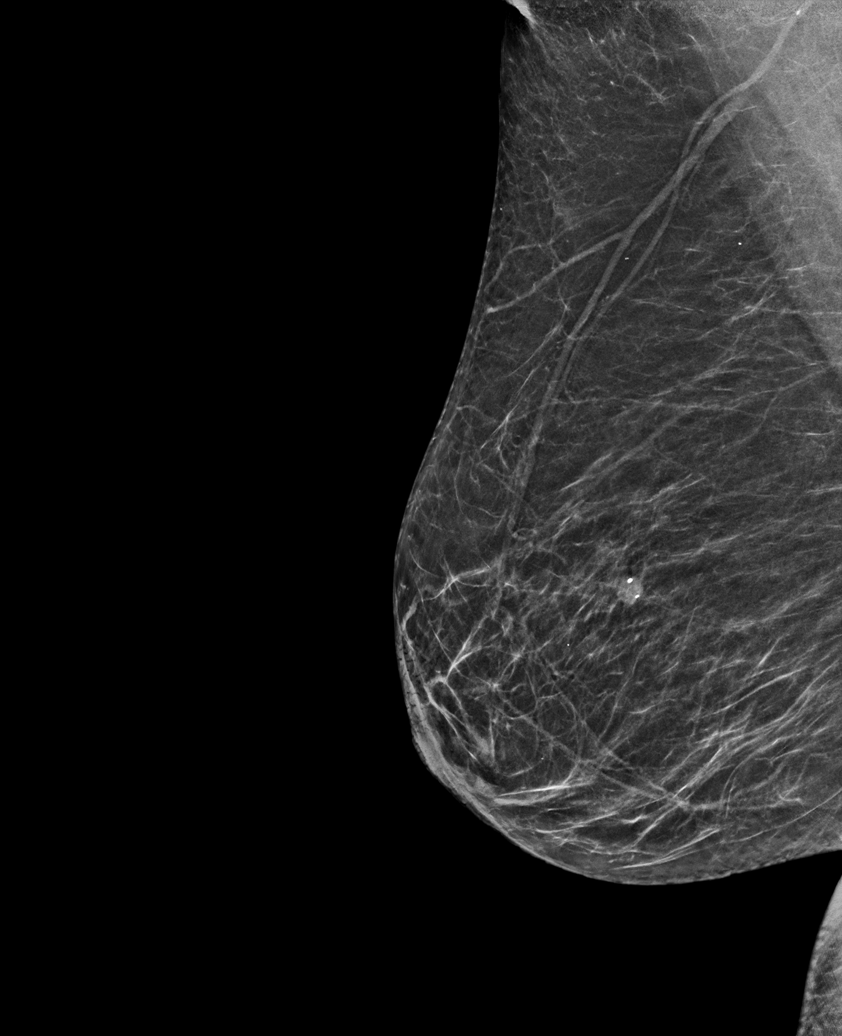

[L MLO synth-2D (2 of 2)]
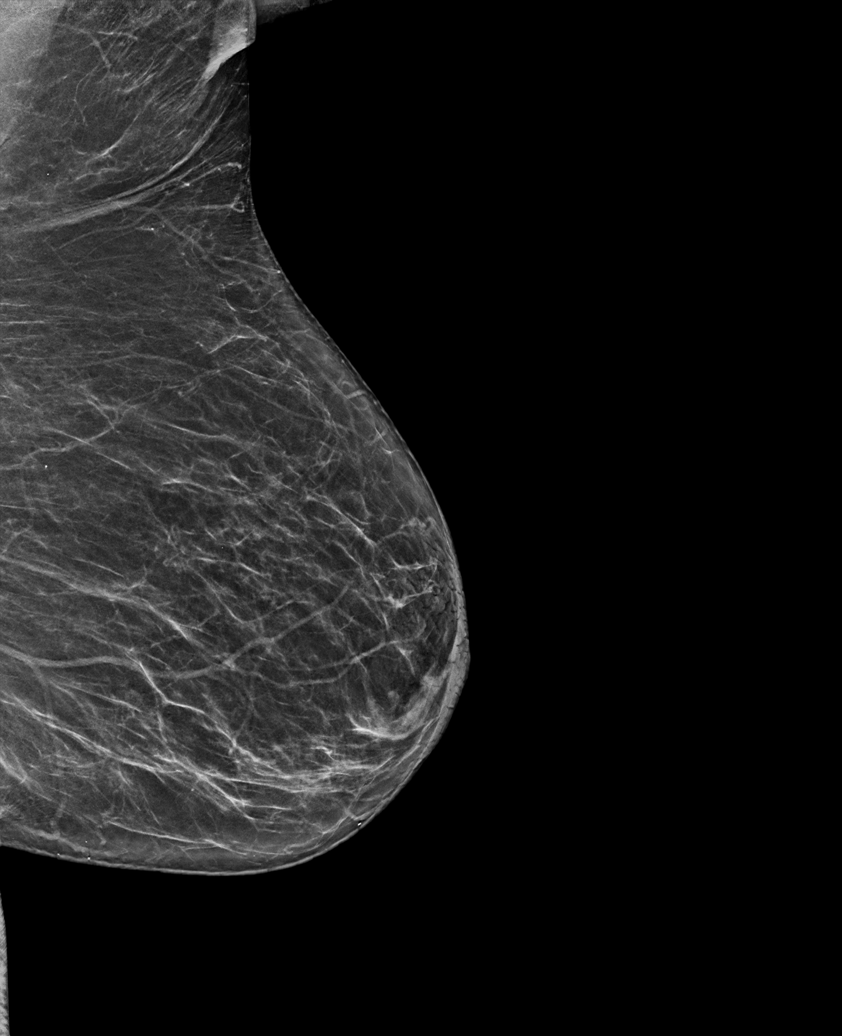

[L MLO tomo · tomo slice 34/67.0]
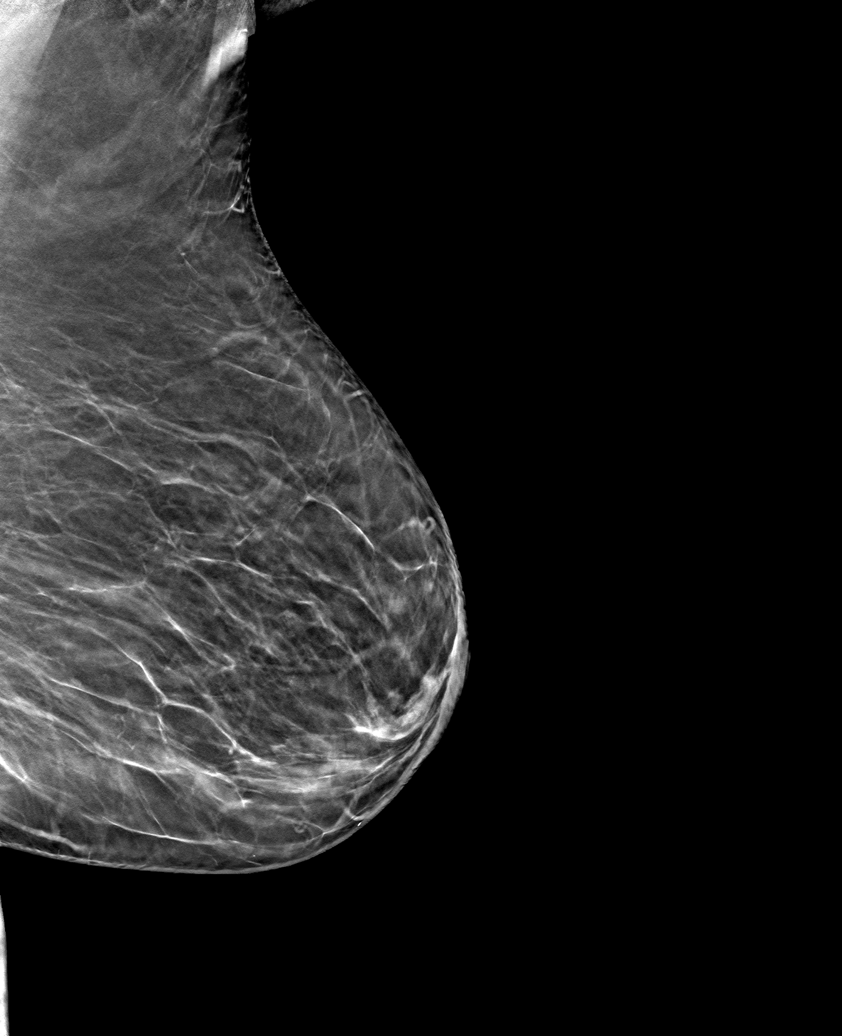

[6 of 30 positions shown; findings below may reference images not displayed]

ACR Breast Density Category b: There are scattered areas of
fibroglandular density.
FINDINGS: There are no findings suspicious for malignancy.
IMPRESSION: No mammographic evidence of malignancy. A result letter of this
screening mammogram will be mailed directly to the patient.

RECOMMENDATION:
Screening mammogram in one year. (Code:51-O-LD2)

BI-RADS CATEGORY  1: Negative.

## 2024-05-12 ENCOUNTER — Other Ambulatory Visit: Payer: Self-pay | Admitting: Medical Genetics

## 2024-05-15 ENCOUNTER — Other Ambulatory Visit
Admission: RE | Admit: 2024-05-15 | Discharge: 2024-05-15 | Disposition: A | Discharge status: Home / Self Care | Payer: Self-pay | Source: Ambulatory Visit | Attending: Medical Genetics | Admitting: Medical Genetics

## 2024-05-17 ENCOUNTER — Encounter: Payer: Self-pay | Admitting: Gastroenterology

## 2024-05-17 ENCOUNTER — Ambulatory Visit: Admitting: Certified Registered"

## 2024-05-17 ENCOUNTER — Encounter
Admission: RE | Disposition: A | Discharge status: Home / Self Care | Payer: Self-pay | Source: Home / Self Care | Attending: Gastroenterology

## 2024-05-17 ENCOUNTER — Ambulatory Visit
Admission: RE | Admit: 2024-05-17 | Discharge: 2024-05-17 | Disposition: A | Discharge status: Home / Self Care | Attending: Gastroenterology | Admitting: Gastroenterology

## 2024-05-17 DIAGNOSIS — K219 Gastro-esophageal reflux disease without esophagitis: Secondary | ICD-10-CM | POA: Insufficient documentation

## 2024-05-17 DIAGNOSIS — F32A Depression, unspecified: Secondary | ICD-10-CM | POA: Insufficient documentation

## 2024-05-17 DIAGNOSIS — I509 Heart failure, unspecified: Secondary | ICD-10-CM | POA: Diagnosis not present

## 2024-05-17 DIAGNOSIS — N1832 Chronic kidney disease, stage 3b: Secondary | ICD-10-CM | POA: Insufficient documentation

## 2024-05-17 DIAGNOSIS — I130 Hypertensive heart and chronic kidney disease with heart failure and stage 1 through stage 4 chronic kidney disease, or unspecified chronic kidney disease: Secondary | ICD-10-CM | POA: Diagnosis not present

## 2024-05-17 DIAGNOSIS — F419 Anxiety disorder, unspecified: Secondary | ICD-10-CM | POA: Insufficient documentation

## 2024-05-17 DIAGNOSIS — Z7984 Long term (current) use of oral hypoglycemic drugs: Secondary | ICD-10-CM | POA: Insufficient documentation

## 2024-05-17 DIAGNOSIS — Z1211 Encounter for screening for malignant neoplasm of colon: Secondary | ICD-10-CM | POA: Diagnosis present

## 2024-05-17 DIAGNOSIS — E1122 Type 2 diabetes mellitus with diabetic chronic kidney disease: Secondary | ICD-10-CM | POA: Diagnosis not present

## 2024-05-17 HISTORY — PX: COLONOSCOPY: SHX5424

## 2024-05-17 LAB — GLUCOSE, CAPILLARY: Glucose-Capillary: 100 mg/dL — ABNORMAL HIGH (ref 70–99)

## 2024-05-17 SURGERY — COLONOSCOPY
Anesthesia: General

## 2024-05-17 MED ORDER — LIDOCAINE HCL (CARDIAC) PF 100 MG/5ML IV SOSY
PREFILLED_SYRINGE | INTRAVENOUS | Status: DC | PRN
  Administered 2024-05-17: 100 mg via INTRAVENOUS

## 2024-05-17 MED ORDER — SODIUM CHLORIDE 0.9 % IV SOLN: INTRAVENOUS | Status: DC

## 2024-05-17 MED ORDER — PROPOFOL 10 MG/ML IV BOLUS
INTRAVENOUS | Status: DC | PRN
  Administered 2024-05-17: 10 mg via INTRAVENOUS
  Administered 2024-05-17: 60 mg via INTRAVENOUS

## 2024-05-17 MED ORDER — PROPOFOL 500 MG/50ML IV EMUL
INTRAVENOUS | Status: DC | PRN
  Administered 2024-05-17: 165 ug/kg/min via INTRAVENOUS

## 2024-05-17 NOTE — Transfer of Care (Signed)
 Immediate Anesthesia Transfer of Care Note  Patient: [facility]  Procedure(s) Performed: COLONOSCOPY  Patient Location: Endoscopy Unit  Anesthesia Type:General  Level of Consciousness: drowsy and patient cooperative  Airway & Oxygen Therapy: Patient Spontanous Breathing and Patient connected to face mask oxygen  Post-op Assessment: Report given to RN and Post -op Vital signs reviewed and stable  Post vital signs: Reviewed and stable  Last Vitals:  Vitals Value Taken Time  BP 144/67 05/17/24 08:50  Temp 35.8 C 05/17/24 08:30  Pulse 52 05/17/24 08:51  Resp 17 05/17/24 08:51  SpO2 100 % 05/17/24 08:51  Vitals shown include unfiled device data.  Last Pain:  Vitals:   05/17/24 0850  TempSrc:   PainSc: 0-No pain         Complications: No notable events documented.

## 2024-05-17 NOTE — Anesthesia Preprocedure Evaluation (Signed)
 Anesthesia Evaluation  Patient identified by MRN, date of birth, ID band Patient awake    Reviewed: Allergy & Precautions, H&P , NPO status , Patient's Chart, lab work & pertinent test results, reviewed documented beta blocker date and time   History of Anesthesia Complications Negative for: history of anesthetic complications  Airway Mallampati: II  TM Distance: >3 FB Neck ROM: full    Dental  (+) Dental Advidsory Given, Teeth Intact   Pulmonary neg pulmonary ROS   Pulmonary exam normal breath sounds clear to auscultation       Cardiovascular Exercise Tolerance: Good hypertension, (-) angina +CHF  (-) Past MI and (-) Cardiac Stents Normal cardiovascular exam(-) dysrhythmias (-) Valvular Problems/Murmurs Rhythm:regular Rate:Normal  ECHO 04/22/21: 1. Borderline inferior Hypo. Left ventricular ejection fraction, by estimation, is 50 to 55%. The left ventricle has low normal function. The left ventricle demonstrates regional wall motion abnormalities (see scoring diagram/findings for description). Left ventricular diastolic parameters were normal.   2. Right ventricular systolic function is normal. The right ventricular size is normal.   3. The mitral valve is normal in structure. Mild mitral valve regurgitation.   4. The aortic valve is normal in structure. Aortic valve regurgitation is not visualized.     Neuro/Psych  PSYCHIATRIC DISORDERS Anxiety Depression    negative neurological ROS     GI/Hepatic Neg liver ROS,GERD  ,,  Endo/Other  diabetes, Well Controlled, Type 2, Oral Hypoglycemic Agents    Renal/GU CRFRenal disease (Stage 3b CKD)  negative genitourinary   Musculoskeletal   Abdominal   Peds  Hematology negative hematology ROS (+)   Anesthesia Other Findings Past Medical History: No date: Anxiety No date: Diabetes mellitus without complication (HCC) No date: GERD (gastroesophageal reflux disease) No date:  High cholesterol No date: Hypercholesteremia No date: Hypertension No date: Lichen sclerosus No date: Vitamin D  deficiency   Reproductive/Obstetrics negative OB ROS                              Anesthesia Physical Anesthesia Plan  ASA: 3  Anesthesia Plan: General   Post-op Pain Management:    Induction: Intravenous  PONV Risk Score and Plan:   Airway Management Planned: Natural Airway and Nasal Cannula  Additional Equipment:   Intra-op Plan:   Post-operative Plan:   Informed Consent: I have reviewed the patients History and Physical, chart, labs and discussed the procedure including the risks, benefits and alternatives for the proposed anesthesia with the patient or authorized representative who has indicated his/her understanding and acceptance.     Dental Advisory Given  Plan Discussed with: Anesthesiologist, CRNA and Surgeon  Anesthesia Plan Comments:          Anesthesia Quick Evaluation

## 2024-05-17 NOTE — Anesthesia Procedure Notes (Signed)
 Procedure Name: General with mask airway Date/Time: 05/17/2024 8:22 AM  Performed by: Ledora Duncan, CRNAPre-anesthesia Checklist: Patient identified, Emergency Drugs available, Suction available and Patient being monitored Patient Re-evaluated:Patient Re-evaluated prior to induction Oxygen Delivery Method: Simple face mask Induction Type: IV induction Placement Confirmation: positive ETCO2 and breath sounds checked- equal and bilateral Dental Injury: Teeth and Oropharynx as per pre-operative assessment

## 2024-05-17 NOTE — H&P (Signed)
 Ruel Kung , MD [address], [place], [place], [address] Phone: [phone] Fax: [phone]  Primary Care Physician:  Rudolpho Norleen BIRCH, MD   Pre-Procedure History & Physical: HPI:  Krystal Hoffman is a 73 y.o. female is here for an colonoscopy.   Past Medical History:  Diagnosis Date   Anxiety    Diabetes mellitus without complication (HCC)    GERD (gastroesophageal reflux disease)    High cholesterol    Hypercholesteremia    Hypertension    Lichen sclerosus    Vitamin D  deficiency     Past Surgical History:  Procedure Laterality Date   SHOULDER SURGERY     tear   TUBAL LIGATION     1986    Prior to Admission medications   Medication Sig Start Date End Date Taking? Authorizing Provider  metoprolol  succinate (TOPROL -XL) 25 MG 24 hr tablet Take 0.5 tablets (12.5 mg total) by mouth daily. 04/30/21 05/17/24 Yes Trudy Anthony HERO, MD  omeprazole (PRILOSEC) 20 MG capsule Take 20 mg by mouth daily.   Yes [provider]  tirzepatide CLOYDE) 15 MG/0.5ML Pen Inject 15 mg into the skin once a week.   Yes [provider]  acetaminophen  (TYLENOL ) 325 MG tablet Take 650 mg by mouth every 6 (six) hours as needed.    [provider]  amiodarone  (PACERONE ) 200 MG tablet Take 200 mg by mouth daily.    [provider]  apixaban  (ELIQUIS ) 5 MG TABS tablet Take 1 tablet (5 mg total) by mouth 2 (two) times daily. HOLD THIS MEDICATION UNTIL YOU SEE YOUR CARDIOLOGIST IN HIS OFFICE 04/29/21   Trudy Anthony HERO, MD  aspirin  325 MG tablet Take 1 tablet (325 mg total) by mouth 2 (two) times daily. 03/12/20   Dickie Begun, MD  atorvastatin  (LIPITOR) 10 MG tablet Take 10 mg by mouth daily.    [provider]  FLUoxetine  (PROZAC ) 20 MG capsule Take 20 mg by mouth daily.    [provider]  furosemide  (LASIX ) 40  MG tablet Take 1 tablet (40 mg total) by mouth daily. Patient not taking: Reported on 12/04/2022 04/30/21 05/30/21  Trudy Anthony HERO, MD  glimepiride (AMARYL) 4 MG tablet Take 2 mg by mouth 2 (two) times daily before a meal. Patient not taking: Reported on 05/17/2024    [provider]  latanoprost  (XALATAN ) 0.005 % ophthalmic solution Place 1 drop into both eyes at bedtime. 12/24/19   [provider]  Multiple Vitamin (MULTIVITAMIN) capsule Take 1 capsule by mouth daily.    [provider]  sitaGLIPtin (JANUVIA) 100 MG tablet Take 100 mg by mouth daily. Patient not taking: Reported on 05/17/2024    [provider]  Vitamin D , Ergocalciferol , (DRISDOL ) 1.25 MG (50000 UNIT) CAPS capsule Take 1 capsule (50,000 Units total) by mouth every 7 (seven) days. Patient taking differently: Take 50,000 Units by mouth 2 (two) times a week. 03/15/20   Dickie Begun, MD    Allergies as of 05/04/2024 - Review Complete 12/04/2022  Allergen Reaction Noted   Metformin Diarrhea 06/06/2015   Metformin and related Diarrhea 03/08/2020    Family History  Problem Relation Age of Onset   Lung cancer Mother    Uterine cancer Mother    Brain cancer Father    Ovarian cancer Neg Hx    Drug abuse Neg Hx    Heart disease Neg Hx    Breast cancer Neg Hx    Colon cancer Neg Hx  Diabetes Neg Hx     Social History   Socioeconomic History   Marital status: Married    Spouse name: Not on file   Number of children: Not on file   Years of education: Not on file   Highest education level: Not on file  Occupational History   Not on file  Tobacco Use   Smoking status: Never   Smokeless tobacco: Never  Vaping Use   Vaping status: Never Used  Substance and Sexual Activity   Alcohol use: No    Comment: Socially    Drug use: No   Sexual activity: Yes    Birth control/protection: Post-menopausal  Other Topics Concern   Not on file  Social History Narrative   ** Merged History  Encounter **       Social Drivers of Health   Financial Resource Strain: Patient Declined (05/31/2023)   Received from [facility]   Overall Financial Resource Strain (CARDIA)    Difficulty of Paying Living Expenses: Patient declined  Food Insecurity: Patient Declined (05/31/2023)   Received from [facility]   Hunger Vital Sign    Within the past 12 months, you worried that your food would run out before you got the money to buy more.: Patient declined    Within the past 12 months, the food you bought just didn't last and you didn't have money to get more.: Patient declined  Transportation Needs: No Transportation Needs (05/31/2023)   Received from [facility] - Transportation    In the past 12 months, has lack of transportation kept you from medical appointments or from getting medications?: No    Lack of Transportation (Non-Medical): No  Physical Activity: Insufficiently Active (03/28/2019)   Exercise Vital Sign    Days of Exercise per Week: 1 day    Minutes of Exercise per Session: 20 min  Stress: Not on file  Social Connections: Not on file  Intimate Partner Violence: Not on file    Review of Systems: See HPI, otherwise negative ROS  Physical Exam: BP (!) 144/72   Pulse (!) 55   Temp (!) 96.3 F (35.7 C) (Temporal)   Resp 18   Ht 5' 7 (1.702 m)   Wt 86.8 kg   SpO2 100%   BMI 29.98 kg/m  General:   Alert,  pleasant and cooperative in NAD Head:  Normocephalic and atraumatic. Neck:  Supple; no masses or thyromegaly. Lungs:  Clear throughout to auscultation, normal respiratory effort.    Heart:  +S1, +S2, Regular rate and rhythm, No edema. Abdomen:  Soft, nontender and nondistended. Normal bowel sounds, without guarding, and without rebound.   Neurologic:  Alert and  oriented x4;  grossly normal neurologically.  Impression/Plan: Krystal Hoffman is here for an colonoscopy to be performed for Screening  colonoscopy average risk   Risks, benefits, limitations, and alternatives regarding  colonoscopy have been reviewed with the patient.  Questions have been answered.  All parties agreeable.   Ruel Kung, MD  05/17/2024, 7:46 AM

## 2024-05-17 NOTE — Op Note (Signed)
 [facility] Gastroenterology Patient Name: Krystal Hoffman Procedure Date: 05/17/2024 8:20 AM MRN: [id number] Account #: [id number] Date of Birth: 12/08/1950 Admit Type: Outpatient Age: 73 Room: [facility] ENDO ROOM 3 Gender: Female Note Status: Finalized Instrument Name: Colon Scope [id number] Procedure:             Colonoscopy Indications:           Screening for colorectal malignant neoplasm Providers:             Ruel Kung MD, MD Medicines:             Monitored Anesthesia Care Complications:         No immediate complications. Procedure:             Pre-Anesthesia Assessment:                        - Prior to the procedure, a History and Physical was                         performed, and patient medications, allergies and                         sensitivities were reviewed. The patient's tolerance                         of previous anesthesia was reviewed.                        - The risks and benefits of the procedure and the                         sedation options and risks were discussed with the                         patient. All questions were answered and informed                         consent was obtained.                        - ASA Grade Assessment: II - A patient with mild                         systemic disease.                        After obtaining informed consent, the colonoscope was                         passed under direct vision. Throughout the procedure,                         the patient's blood pressure, pulse, and oxygen                         saturations were monitored continuously. The                         Colonoscope was introduced through the anus with the  intention of advancing to the cecum. The scope was                         advanced to the sigmoid colon before the procedure was                         aborted. Medications were given. The colonoscopy was                         performed with ease.  The patient tolerated the                         procedure well. The quality of the bowel preparation                         was poor. Findings:      The perianal and digital rectal examinations were normal.      A moderate amount of semi-liquid stool was found in the recto-sigmoid       colon, interfering with visualization. Impression:            - Preparation of the colon was poor.                        - Stool in the recto-sigmoid colon.                        - No specimens collected. Recommendation:        - Discharge patient to home (with escort).                        - Resume previous diet.                        - Continue present medications.                        - Repeat colonoscopy in 2 weeks because the bowel                         preparation was suboptimal. Procedure Code(s):     --- Professional ---                        [phone], 53, Colonoscopy, flexible; diagnostic,                         including collection of specimen(s) by brushing or                         washing, when performed (separate procedure) Diagnosis Code(s):     --- Professional ---                        Z12.11, Encounter for screening for malignant neoplasm                         of colon CPT copyright 2022 American Medical Association. All rights reserved. The codes documented in this report are preliminary and upon coder review may  be revised to meet current compliance requirements. Ruel Kung,  MD Ruel Kung MD, MD 05/17/2024 8:26:51 AM This report has been signed electronically. Number of Addenda: 0 Note Initiated On: 05/17/2024 8:20 AM Total Procedure Duration: 0 hours 1 minute 1 second  Estimated Blood Loss:  Estimated blood loss: none.      [facility]

## 2024-05-18 NOTE — Anesthesia Postprocedure Evaluation (Signed)
 Anesthesia Post Note  Patient: Krystal Hoffman  Procedure(s) Performed: COLONOSCOPY  Patient location during evaluation: Endoscopy Anesthesia Type: General Level of consciousness: awake and alert Pain management: pain level controlled Vital Signs Assessment: post-procedure vital signs reviewed and stable Respiratory status: spontaneous breathing, nonlabored ventilation, respiratory function stable and patient connected to nasal cannula oxygen Cardiovascular status: blood pressure returned to baseline and stable Postop Assessment: no apparent nausea or vomiting Anesthetic complications: no   No notable events documented.   Last Vitals:  Vitals:   05/17/24 0840 05/17/24 0850  BP: (!) 147/66 (!) 144/67  Pulse: (!) 51 (!) 50  Resp: (!) 21 20  Temp:    SpO2: 95% 99%    Last Pain:  Vitals:   05/17/24 0850  TempSrc:   PainSc: 0-No pain                 Prentice Murphy

## 2024-05-23 LAB — GENECONNECT MOLECULAR SCREEN: Genetic Analysis Overall Interpretation: NEGATIVE

## 2024-06-07 ENCOUNTER — Encounter
Admission: RE | Disposition: A | Discharge status: Home / Self Care | Payer: Self-pay | Source: Home / Self Care | Attending: Gastroenterology

## 2024-06-07 ENCOUNTER — Ambulatory Visit
Admission: RE | Admit: 2024-06-07 | Discharge: 2024-06-07 | Disposition: A | Discharge status: Home / Self Care | Attending: Gastroenterology | Admitting: Gastroenterology

## 2024-06-07 ENCOUNTER — Ambulatory Visit: Admitting: Anesthesiology

## 2024-06-07 ENCOUNTER — Encounter: Payer: Self-pay | Admitting: Gastroenterology

## 2024-06-07 DIAGNOSIS — Z1211 Encounter for screening for malignant neoplasm of colon: Secondary | ICD-10-CM | POA: Insufficient documentation

## 2024-06-07 DIAGNOSIS — K219 Gastro-esophageal reflux disease without esophagitis: Secondary | ICD-10-CM | POA: Insufficient documentation

## 2024-06-07 DIAGNOSIS — I4891 Unspecified atrial fibrillation: Secondary | ICD-10-CM | POA: Insufficient documentation

## 2024-06-07 DIAGNOSIS — Z7985 Long-term (current) use of injectable non-insulin antidiabetic drugs: Secondary | ICD-10-CM | POA: Insufficient documentation

## 2024-06-07 DIAGNOSIS — E119 Type 2 diabetes mellitus without complications: Secondary | ICD-10-CM | POA: Insufficient documentation

## 2024-06-07 DIAGNOSIS — Z87891 Personal history of nicotine dependence: Secondary | ICD-10-CM | POA: Insufficient documentation

## 2024-06-07 DIAGNOSIS — I10 Essential (primary) hypertension: Secondary | ICD-10-CM | POA: Insufficient documentation

## 2024-06-07 HISTORY — PX: COLONOSCOPY: SHX5424

## 2024-06-07 LAB — GLUCOSE, CAPILLARY: Glucose-Capillary: 91 mg/dL (ref 70–99)

## 2024-06-07 SURGERY — COLONOSCOPY
Anesthesia: General

## 2024-06-07 MED ORDER — PROPOFOL 1000 MG/100ML IV EMUL
INTRAVENOUS | Status: AC
  Filled 2024-06-07: qty 100

## 2024-06-07 MED ORDER — SODIUM CHLORIDE 0.9 % IV SOLN
INTRAVENOUS | Status: DC
  Administered 2024-06-07: 20 mL/h via INTRAVENOUS

## 2024-06-07 MED ORDER — STERILE WATER FOR IRRIGATION IR SOLN
Status: DC | PRN
Start: 2024-06-07 — End: 2024-06-07
  Administered 2024-06-07: 60 mL

## 2024-06-07 MED ORDER — PROPOFOL 10 MG/ML IV BOLUS
INTRAVENOUS | Status: DC | PRN
  Administered 2024-06-07: 60 mg via INTRAVENOUS

## 2024-06-07 MED ORDER — PROPOFOL 500 MG/50ML IV EMUL
INTRAVENOUS | Status: DC | PRN
Start: 2024-06-07 — End: 2024-06-07
  Administered 2024-06-07: 100 ug/kg/min via INTRAVENOUS

## 2024-06-07 NOTE — H&P (Signed)
 Ruel Kung , MD [address], [address], [place], [place], [address] Phone: [phone] Fax: [phone]  Primary Care Physician:  Rudolpho Norleen BIRCH, MD   Pre-Procedure History & Physical: HPI:  Krystal Hoffman is a 73 y.o. female is here for an colonoscopy.   Past Medical History:  Diagnosis Date   Anxiety    Diabetes mellitus without complication (HCC)    GERD (gastroesophageal reflux disease)    High cholesterol    Hypercholesteremia    Hypertension    Lichen sclerosus    Vitamin D  deficiency     Past Surgical History:  Procedure Laterality Date   COLONOSCOPY N/A 05/17/2024   Procedure: COLONOSCOPY;  Surgeon: Kung Ruel, MD;  Location: [facility] ENDOSCOPY;  Service: Gastroenterology;  Laterality: N/A;  MOUNJARO / Eliquis    SHOULDER SURGERY     tear   TUBAL LIGATION     1986    Prior to Admission medications   Medication Sig Start Date End Date Taking? Authorizing Provider  acetaminophen  (TYLENOL ) 325 MG tablet Take 650 mg by mouth every 6 (six) hours as needed.   Yes [provider]  amiodarone  (PACERONE ) 200 MG tablet Take 200 mg by mouth daily.   Yes [provider]  apixaban  (ELIQUIS ) 5 MG TABS tablet Take 1 tablet (5 mg total) by mouth 2 (two) times daily. HOLD THIS MEDICATION UNTIL YOU SEE YOUR CARDIOLOGIST IN HIS OFFICE 04/29/21  Yes Trudy Anthony HERO, MD  aspirin  325 MG tablet Take 1 tablet (325 mg total) by mouth 2 (two) times daily. 03/12/20  Yes Dickie Begun, MD  atorvastatin  (LIPITOR) 10 MG tablet Take 10 mg by mouth daily.   Yes [provider]  FLUoxetine  (PROZAC ) 20 MG capsule Take 20 mg by mouth daily.   Yes [provider]  latanoprost  (XALATAN ) 0.005 % ophthalmic solution Place 1 drop into both eyes at bedtime. 12/24/19  Yes [provider]  Multiple Vitamin (MULTIVITAMIN) capsule Take 1  capsule by mouth daily.   Yes [provider]  omeprazole (PRILOSEC) 20 MG capsule Take 20 mg by mouth daily.   Yes [provider]  tirzepatide CLOYDE) 15 MG/0.5ML Pen Inject 15 mg into the skin once a week.   Yes [provider]  Vitamin D , Ergocalciferol , (DRISDOL ) 1.25 MG (50000 UNIT) CAPS capsule Take 1 capsule (50,000 Units total) by mouth every 7 (seven) days. Patient taking differently: Take 50,000 Units by mouth 2 (two) times a week. 03/15/20  Yes Dickie Begun, MD  furosemide  (LASIX ) 40 MG tablet Take 1 tablet (40 mg total) by mouth daily. Patient not taking: Reported on 12/04/2022 04/30/21 05/30/21  Trudy Anthony HERO, MD  glimepiride (AMARYL) 4 MG tablet Take 2 mg by mouth 2 (two) times daily before a meal. Patient not taking: Reported on 05/17/2024    [provider]  metoprolol  succinate (TOPROL -XL) 25 MG 24 hr tablet Take 0.5 tablets (12.5 mg total) by mouth daily. 04/30/21 05/17/24  Trudy Anthony HERO, MD  sitaGLIPtin (JANUVIA) 100 MG tablet Take 100 mg by mouth daily. Patient not taking: Reported on 05/17/2024    [provider]    Allergies as of 05/31/2024 - Review Complete 05/17/2024  Allergen Reaction Noted   Metformin Diarrhea 06/06/2015   Metformin and related Diarrhea 03/08/2020    Family History  Problem Relation Age of Onset   Lung cancer Mother    Uterine cancer Mother    Brain cancer Father    Ovarian cancer Neg Hx  Drug abuse Neg Hx    Heart disease Neg Hx    Breast cancer Neg Hx    Colon cancer Neg Hx    Diabetes Neg Hx     Social History   Socioeconomic History   Marital status: Married    Spouse name: Not on file   Number of children: Not on file   Years of education: Not on file   Highest education level: Not on file  Occupational History   Not on file  Tobacco Use   Smoking status: Never   Smokeless tobacco: Never  Vaping Use   Vaping status: Never Used  Substance and Sexual Activity   Alcohol  use: No    Comment: Socially    Drug use: No   Sexual activity: Yes    Birth control/protection: Post-menopausal  Other Topics Concern   Not on file  Social History Narrative   ** Merged History Encounter **       Social Drivers of Health   Financial Resource Strain: Low Risk  (05/30/2024)   Received from [facility]   Overall Financial Resource Strain (CARDIA)    Difficulty of Paying Living Expenses: Not hard at all  Food Insecurity: No Food Insecurity (05/30/2024)   Received from [facility]   Hunger Vital Sign    Within the past 12 months, you worried that your food would run out before you got the money to buy more.: Never true    Within the past 12 months, the food you bought just didn't last and you didn't have money to get more.: Never true  Transportation Needs: No Transportation Needs (05/30/2024)   Received from [facility] - Transportation    In the past 12 months, has lack of transportation kept you from medical appointments or from getting medications?: No    Lack of Transportation (Non-Medical): No  Physical Activity: Insufficiently Active (03/28/2019)   Exercise Vital Sign    Days of Exercise per Week: 1 day    Minutes of Exercise per Session: 20 min  Stress: Not on file  Social Connections: Not on file  Intimate Partner Violence: Not on file    Review of Systems: See HPI, otherwise negative ROS  Physical Exam: BP 119/89   Temp (!) 96 F (35.6 C) (Temporal)   Resp 20   Ht 5' 7 (1.702 m)   Wt 84.4 kg   SpO2 100%   BMI 29.13 kg/m  General:   Alert,  pleasant and cooperative in NAD Head:  Normocephalic and atraumatic. Neck:  Supple; no masses or thyromegaly. Lungs:  Clear throughout to auscultation, normal respiratory effort.    Heart:  +S1, +S2, Regular rate and rhythm, No edema. Abdomen:  Soft, nontender and nondistended. Normal bowel sounds, without guarding, and without rebound.    Neurologic:  Alert and  oriented x4;  grossly normal neurologically.  Impression/Plan: Krystal Hoffman is here for an colonoscopy to be performed for Screening colonoscopy average risk   Risks, benefits, limitations, and alternatives regarding  colonoscopy have been reviewed with the patient.  Questions have been answered.  All parties agreeable.   Ruel Kung, MD  06/07/2024, 7:49 AM

## 2024-06-07 NOTE — Op Note (Signed)
 [facility] Gastroenterology Patient Name: Krystal Hoffman Procedure Date: 06/07/2024 7:48 AM MRN: [id number] Account #: [id number] Date of Birth: 1950/11/23 Admit Type: Outpatient Age: 73 Room: [facility] ENDO ROOM 3 Gender: Female Note Status: Finalized Instrument Name: Colon Scope [id number] Procedure:             Colonoscopy Indications:           Screening for colorectal malignant neoplasm Providers:             Ruel Kung MD, MD Medicines:             Monitored Anesthesia Care Complications:         No immediate complications. Procedure:             Pre-Anesthesia Assessment:                        - Prior to the procedure, a History and Physical was                         performed, and patient medications, allergies and                         sensitivities were reviewed. The patient's tolerance                         of previous anesthesia was reviewed.                        - The risks and benefits of the procedure and the                         sedation options and risks were discussed with the                         patient. All questions were answered and informed                         consent was obtained.                        - ASA Grade Assessment: II - A patient with mild                         systemic disease.                        After obtaining informed consent, the colonoscope was                         passed under direct vision. Throughout the procedure,                         the patient's blood pressure, pulse, and oxygen                         saturations were monitored continuously. The                         Colonoscope was introduced through the anus and  advanced to the the cecum, identified by the                         appendiceal orifice. The colonoscopy was performed                         with ease. The patient tolerated the procedure well.                         The quality of the bowel preparation  was adequate. The                         ileocecal valve, appendiceal orifice, and rectum were                         photographed. Findings:      The perianal and digital rectal examinations were normal.      The entire examined colon appeared normal on direct and retroflexion       views. Impression:            - The entire examined colon is normal on direct and                         retroflexion views.                        - No specimens collected. Recommendation:        - Discharge patient to home (with escort).                        - Resume previous diet.                        - Continue present medications.                        - Repeat colonoscopy in 10 years for screening                         purposes. Procedure Code(s):     --- Professional ---                        [phone], Colonoscopy, flexible; diagnostic, including                         collection of specimen(s) by brushing or washing, when                         performed (separate procedure) Diagnosis Code(s):     --- Professional ---                        Z12.11, Encounter for screening for malignant neoplasm                         of colon CPT copyright 2022 American Medical Association. All rights reserved. The codes documented in this report are preliminary and upon coder review may  be revised to meet current compliance requirements. Ruel Kung, MD Ruel Kung MD, MD 06/07/2024 8:16:11 AM This report has  been signed electronically. Number of Addenda: 0 Note Initiated On: 06/07/2024 7:48 AM Scope Withdrawal Time: 0 hours 10 minutes 11 seconds  Total Procedure Duration: 0 hours 13 minutes 55 seconds  Estimated Blood Loss:  Estimated blood loss: none.      [facility]

## 2024-06-07 NOTE — Anesthesia Postprocedure Evaluation (Signed)
 Anesthesia Post Note  Patient: Krystal Hoffman  Procedure(s) Performed: COLONOSCOPY  Patient location during evaluation: PACU Anesthesia Type: General Level of consciousness: awake and awake and alert Pain management: pain level controlled Vital Signs Assessment: post-procedure vital signs reviewed and stable Respiratory status: spontaneous breathing Cardiovascular status: stable Anesthetic complications: no   There were no known notable events for this encounter.   Last Vitals:  Vitals:   06/07/24 0818 06/07/24 0828  BP: 116/76 121/85  Pulse: 71   Resp: 11 16  Temp:    SpO2: 100% 100%    Last Pain:  Vitals:   06/07/24 0828  TempSrc:   PainSc: 0-No pain                 VAN STAVEREN,Regena Delucchi

## 2024-06-07 NOTE — Anesthesia Preprocedure Evaluation (Signed)
 Anesthesia Evaluation  Patient identified by MRN, date of birth, ID band Patient awake    Reviewed: Allergy & Precautions, NPO status , Patient's Chart, lab work & pertinent test results  Airway Mallampati: II  TM Distance: >3 FB Neck ROM: Full    Dental  (+) Teeth Intact   Pulmonary neg pulmonary ROS, former smoker   Pulmonary exam normal breath sounds clear to auscultation       Cardiovascular Exercise Tolerance: Good hypertension, Pt. on medications negative cardio ROS Normal cardiovascular exam+ dysrhythmias Atrial Fibrillation  Rhythm:Irregular     Neuro/Psych negative neurological ROS  negative psych ROS   GI/Hepatic negative GI ROS, Neg liver ROS,GERD  ,,  Endo/Other  negative endocrine ROSdiabetes, Type 2, Oral Hypoglycemic Agents    Renal/GU negative Renal ROS  negative genitourinary   Musculoskeletal negative musculoskeletal ROS (+)    Abdominal   Peds negative pediatric ROS (+)  Hematology negative hematology ROS (+)   Anesthesia Other Findings Past Medical History: No date: Anxiety No date: Diabetes mellitus without complication (HCC) No date: GERD (gastroesophageal reflux disease) No date: High cholesterol No date: Hypercholesteremia No date: Hypertension No date: Lichen sclerosus No date: Vitamin D  deficiency  Past Surgical History: 05/17/2024: COLONOSCOPY; N/A     Comment:  Procedure: COLONOSCOPY;  Surgeon: Therisa Bi, MD;                Location: [facility] ENDOSCOPY;  Service: Gastroenterology;                Laterality: N/A;  MOUNJARO / Eliquis  No date: SHOULDER SURGERY     Comment:  tear No date: TUBAL LIGATION     Comment:  1986  BMI    Body Mass Index: 29.13 kg/m      Reproductive/Obstetrics negative OB ROS                              Anesthesia Physical Anesthesia Plan  ASA: 3  Anesthesia Plan: General   Post-op Pain Management:    Induction:  Intravenous  PONV Risk Score and Plan: Propofol  infusion and TIVA  Airway Management Planned: Natural Airway and Nasal Cannula  Additional Equipment:   Intra-op Plan:   Post-operative Plan:   Informed Consent: I have reviewed the patients History and Physical, chart, labs and discussed the procedure including the risks, benefits and alternatives for the proposed anesthesia with the patient or authorized representative who has indicated his/her understanding and acceptance.     Dental Advisory Given  Plan Discussed with: CRNA  Anesthesia Plan Comments:         Anesthesia Quick Evaluation

## 2024-06-07 NOTE — Transfer of Care (Signed)
 Immediate Anesthesia Transfer of Care Note  Patient: Krystal Hoffman  Procedure(s) Performed: COLONOSCOPY  Patient Location: PACU  Anesthesia Type:General  Level of Consciousness: awake and sedated  Airway & Oxygen Therapy: Patient Spontanous Breathing and Patient connected to nasal cannula oxygen  Post-op Assessment: Report given to RN and Post -op Vital signs reviewed and stable  Post vital signs: Reviewed and stable  Last Vitals:  Vitals Value Taken Time  BP    Temp    Pulse    Resp    SpO2      Last Pain:  Vitals:   06/07/24 0717  TempSrc: Temporal  PainSc: 0-No pain         Complications: There were no known notable events for this encounter.

## 2024-10-10 ENCOUNTER — Ambulatory Visit: Admit: 2024-10-10 | Admitting: Cardiology
